# Patient Record
Sex: Male | Born: 1955
Health system: Southern US, Community
[De-identification: ages and names within clinical notes are randomized; demographics above are authoritative.]

## PROBLEM LIST (undated history)

## (undated) DIAGNOSIS — R011 Cardiac murmur, unspecified: Secondary | ICD-10-CM

## (undated) DIAGNOSIS — F419 Anxiety disorder, unspecified: Secondary | ICD-10-CM

## (undated) DIAGNOSIS — E785 Hyperlipidemia, unspecified: Secondary | ICD-10-CM

## (undated) DIAGNOSIS — T7840XA Allergy, unspecified, initial encounter: Secondary | ICD-10-CM

## (undated) DIAGNOSIS — F32A Depression, unspecified: Secondary | ICD-10-CM

## (undated) DIAGNOSIS — F329 Major depressive disorder, single episode, unspecified: Secondary | ICD-10-CM

## (undated) DIAGNOSIS — N400 Enlarged prostate without lower urinary tract symptoms: Secondary | ICD-10-CM

## (undated) DIAGNOSIS — K5790 Diverticulosis of intestine, part unspecified, without perforation or abscess without bleeding: Secondary | ICD-10-CM

## (undated) DIAGNOSIS — I341 Nonrheumatic mitral (valve) prolapse: Secondary | ICD-10-CM

## (undated) HISTORY — PX: WISDOM TOOTH EXTRACTION: SHX21

## (undated) HISTORY — DX: Major depressive disorder, single episode, unspecified: F32.9

## (undated) HISTORY — DX: Diverticulosis of intestine, part unspecified, without perforation or abscess without bleeding: K57.90

## (undated) HISTORY — DX: Benign prostatic hyperplasia without lower urinary tract symptoms: N40.0

## (undated) HISTORY — DX: Allergy, unspecified, initial encounter: T78.40XA

## (undated) HISTORY — DX: Depression, unspecified: F32.A

## (undated) HISTORY — DX: Anxiety disorder, unspecified: F41.9

## (undated) HISTORY — DX: Hyperlipidemia, unspecified: E78.5

## (undated) HISTORY — DX: Nonrheumatic mitral (valve) prolapse: I34.1

## (undated) HISTORY — DX: Cardiac murmur, unspecified: R01.1

---

## 1997-08-21 ENCOUNTER — Encounter (HOSPITAL_COMMUNITY): Admission: RE | Admit: 1997-08-21 | Discharge: 1997-11-19 | Payer: Self-pay | Admitting: Psychiatry

## 2006-09-13 ENCOUNTER — Ambulatory Visit: Payer: Self-pay | Admitting: Internal Medicine

## 2006-09-25 ENCOUNTER — Ambulatory Visit: Payer: Self-pay | Admitting: Internal Medicine

## 2009-04-09 ENCOUNTER — Encounter: Admission: RE | Admit: 2009-04-09 | Discharge: 2009-04-09 | Payer: Self-pay | Admitting: Family Medicine

## 2011-09-06 ENCOUNTER — Encounter: Payer: Self-pay | Admitting: Internal Medicine

## 2012-08-28 ENCOUNTER — Encounter: Payer: Self-pay | Admitting: Internal Medicine

## 2012-11-19 LAB — HEPATIC FUNCTION PANEL
ALK PHOS: 60 U/L (ref 25–125)
ALT: 17 U/L (ref 10–40)
AST: 19 U/L (ref 14–40)
BILIRUBIN, TOTAL: 0.5 mg/dL

## 2012-11-19 LAB — LIPID PANEL
Cholesterol: 168 mg/dL (ref 0–200)
HDL: 101 mg/dL — AB (ref 35–70)
LDL CALC: 87 mg/dL
LDl/HDL Ratio: 6
TRIGLYCERIDES: 41 mg/dL (ref 40–160)

## 2012-11-19 LAB — BASIC METABOLIC PANEL
BUN: 13 mg/dL (ref 4–21)
CREATININE: 1 mg/dL (ref 0.6–1.3)
GLUCOSE: 101 mg/dL
POTASSIUM: 4.1 mmol/L (ref 3.4–5.3)
Sodium: 139 mmol/L (ref 137–147)

## 2012-11-19 LAB — CBC AND DIFFERENTIAL
HEMATOCRIT: 43 % (ref 41–53)
Hemoglobin: 14.7 g/dL (ref 13.5–17.5)
PLATELETS: 212 10*3/uL (ref 150–399)

## 2012-11-19 LAB — PSA: PSA: 1.81

## 2013-03-28 ENCOUNTER — Encounter: Payer: Self-pay | Admitting: Internal Medicine

## 2013-05-22 ENCOUNTER — Encounter: Payer: Self-pay | Admitting: Internal Medicine

## 2014-11-17 ENCOUNTER — Ambulatory Visit (INDEPENDENT_AMBULATORY_CARE_PROVIDER_SITE_OTHER): Payer: 59 | Admitting: Internal Medicine

## 2014-11-17 ENCOUNTER — Ambulatory Visit (INDEPENDENT_AMBULATORY_CARE_PROVIDER_SITE_OTHER): Payer: 59

## 2014-11-17 VITALS — BP 132/84 | HR 78 | Temp 98.9°F | Resp 16 | Ht 71.0 in | Wt 186.8 lb

## 2014-11-17 DIAGNOSIS — S63502A Unspecified sprain of left wrist, initial encounter: Secondary | ICD-10-CM | POA: Diagnosis not present

## 2014-11-17 DIAGNOSIS — M25532 Pain in left wrist: Secondary | ICD-10-CM
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
## 2015-04-22 LAB — BASIC METABOLIC PANEL
BUN: 11 mg/dL (ref 4–21)
CREATININE: 1 mg/dL (ref 0.6–1.3)
Glucose: 89 mg/dL
POTASSIUM: 4.9 mmol/L (ref 3.4–5.3)
Sodium: 140 mmol/L (ref 137–147)

## 2015-04-22 LAB — HEPATIC FUNCTION PANEL
ALK PHOS: 75 U/L (ref 25–125)
ALT: 21 U/L (ref 10–40)
AST: 19 U/L (ref 14–40)
BILIRUBIN, TOTAL: 0.5 mg/dL

## 2015-04-22 LAB — LIPID PANEL
Cholesterol: 207 mg/dL — AB (ref 0–200)
HDL: 81 mg/dL — AB (ref 35–70)
LDL Cholesterol: 117 mg/dL
Triglycerides: 42 mg/dL (ref 40–160)

## 2015-04-22 LAB — HEMOGLOBIN A1C: HEMOGLOBIN A1C: 5.8

## 2015-10-01 ENCOUNTER — Ambulatory Visit (INDEPENDENT_AMBULATORY_CARE_PROVIDER_SITE_OTHER): Payer: 59 | Admitting: Internal Medicine

## 2015-10-01 ENCOUNTER — Encounter: Payer: Self-pay | Admitting: Internal Medicine

## 2015-10-01 VITALS — BP 130/78 | HR 62 | Temp 98.4°F | Ht 71.0 in | Wt 186.0 lb

## 2015-10-01 DIAGNOSIS — Z13220 Encounter for screening for lipoid disorders: Secondary | ICD-10-CM | POA: Diagnosis not present

## 2015-10-01 DIAGNOSIS — N400 Enlarged prostate without lower urinary tract symptoms: Secondary | ICD-10-CM | POA: Insufficient documentation

## 2015-10-01 DIAGNOSIS — I341 Nonrheumatic mitral (valve) prolapse: Secondary | ICD-10-CM

## 2015-10-01 DIAGNOSIS — Z1159 Encounter for screening for other viral diseases: Secondary | ICD-10-CM | POA: Diagnosis not present

## 2015-10-01 DIAGNOSIS — K5790 Diverticulosis of intestine, part unspecified, without perforation or abscess without bleeding: Secondary | ICD-10-CM | POA: Insufficient documentation

## 2015-10-01 DIAGNOSIS — Z114 Encounter for screening for human immunodeficiency virus [HIV]: Secondary | ICD-10-CM

## 2015-10-01 DIAGNOSIS — J309 Allergic rhinitis, unspecified: Secondary | ICD-10-CM

## 2015-10-01 NOTE — Progress Notes (Signed)
Location:  [facility] clinic Provider:  Taylore Hinde L. Mariea Clonts, D.O., C.M.D.  Code Status: full code  Goals of Care:  Advanced Directives 10/01/2015  Does patient have an advance directive? No  Would patient like information on creating an advanced directive? No - patient declined information  They are planning to update the whole package of will, hcpoa, advance directive.  Chief Complaint  Patient presents with  . Establish Care    new patient    HPI: Patient is a 60 y.o. male with h/o diverticulosis, MVP, depression, anxiety, hyperlipidemia, and BPH seen today to establish with me at [facility].  His mother-in-law is seen by me.    He does not have any concerns at this time.  Wanted to get started and be forward thinking.    Diverticulosis:  Just was noted on cscope.  No polyps in 2008.    MVP:  Noted during exams.  Dentists used to worry about it.    Depression/anxiety:  Spirits.  No anxiety lately.    Hyperlipidemia:  High normal or so in past, but says last one nothing was mentioned.  BPH:  No symptoms.  Past Medical History  Diagnosis Date  . Diverticulosis   . Mitral valve prolapse   . Depression   . Anxiety   . Hyperlipidemia   . BPH (benign prostatic hyperplasia)     Past Surgical History  Procedure Laterality Date  . Wisdom tooth extraction     Social History   Social History  . Marital Status: Married    Spouse Name: N/A  . Number of Children: N/A  . Years of Education: N/A   Social History Main Topics  . Smoking status: Former Smoker    Quit date: 11/10/2014  . Smokeless tobacco: Never Used  . Alcohol Use: 3.0 - 4.2 oz/week    5-7 Standard drinks or equivalent per week  . Drug Use: No  . Sexual Activity: Not Asked   Other Topics Concern  . None   Social History Narrative   Diet: Regular      Do you drink/ eat things with caffeine? Yes      Marital status:   Maried                            What year were you married ? 1983      Do you live in a house,  apartment,assistred living, condo, trailer, etc.)? House      Is it one or more stories?  No      How many persons live in your home ?  1      Do you have any pets in your home ?(please list) No      Current or past profession: Freight forwarder at [employer] you exercise? Occasionally                               Type & how often: Walk,Run Bike      Do you have a living will? No      Do you have a DNR form?   No                    If not, do you want to discuss one? Will do so      Do you have signed POA?HPOA forms? No  If so, please bring to your        appointment            Family History  Problem Relation Age of Onset  . Colon cancer Paternal Aunt    No Known Allergies    Medication List       This list is accurate as of: 10/01/15  9:24 AM.  Always use your most recent med list.               cetirizine 10 MG tablet  Commonly known as:  ZYRTEC  Take 10 mg by mouth daily.        Review of Systems:  Review of Systems  Constitutional: Negative for fever, chills and malaise/fatigue.  HENT: Positive for congestion. Negative for hearing loss.        Sinus problems  Eyes: Negative for blurred vision.       Glasses  Respiratory: Negative for cough, shortness of breath and wheezing.   Cardiovascular: Negative for chest pain, palpitations and leg swelling.       MVP, murmur  Gastrointestinal: Negative for abdominal pain, constipation, blood in stool and melena.       Diverticulosis  Genitourinary: Negative for dysuria, urgency and frequency.  Musculoskeletal: Positive for joint pain. Negative for falls.       Left index finger from overuse of mouse  Skin: Negative for itching and rash.  Neurological: Negative for dizziness, focal weakness, loss of consciousness, weakness and headaches.  Endo/Heme/Allergies: Does not bruise/bleed easily.  Psychiatric/Behavioral: Negative for depression and memory loss. The patient is not nervous/anxious and does not have  insomnia.     Health Maintenance  Topic Date Due  . Hepatitis C Screening  1956-02-24  . HIV Screening  11/12/1970  . INFLUENZA VACCINE  12/22/2015  . TETANUS/TDAP  05/23/2016  . COLONOSCOPY  09/24/2016    Physical Exam: Filed Vitals:   10/01/15 0848  BP: 130/78  Pulse: 62  Temp: 98.4 F (36.9 C)  TempSrc: Oral  Height: 5\' 11"  (1.803 m)  Weight: 186 lb (84.369 kg)  SpO2: 98%   Body mass index is 25.95 kg/(m^2). Physical Exam  Constitutional: He is oriented to person, place, and time. He appears well-developed and well-nourished. No distress.  HENT:  Head: Normocephalic and atraumatic.  Cardiovascular: Normal rate, regular rhythm and intact distal pulses.   Murmur heard. Midsystolic click, 2/6 systolic murmur heard throughout precordium  Pulmonary/Chest: Effort normal and breath sounds normal. No respiratory distress.  Abdominal: Soft. Bowel sounds are normal. He exhibits no distension and no mass. There is no tenderness.  Musculoskeletal: Normal range of motion. He exhibits no tenderness.  Neurological: He is alert and oriented to person, place, and time.  Skin: Skin is warm and dry.  Psychiatric: He has a normal mood and affect. His behavior is normal. Judgment and thought content normal.    Labs reviewed: Basic Metabolic Panel:  Recent Labs  04/22/15  NA 140  K 4.9  BUN 11  CREATININE 1.0   Liver Function Tests:  Recent Labs  04/22/15  AST 19  ALT 21  ALKPHOS 75   No results for input(s): LIPASE, AMYLASE in the last 8760 hours. No results for input(s): AMMONIA in the last 8760 hours. CBC: No results for input(s): WBC, NEUTROABS, HGB, HCT, MCV, PLT in the last 8760 hours. Lipid Panel:  Recent Labs  04/22/15  CHOL 207*  HDL 81*  LDLCALC 117  TRIG 42   Lab Results  Component Value Date   HGBA1C 5.8 04/22/2015    Assessment/Plan 1. Mitral valve prolapse -with some mitral regurg noted -he has never had symptoms, was noted when he was a young  adult  2. Allergic sinusitis - cont zyrtec for allergies - CBC with Differential/Platelet; Future  3. BPH (benign prostatic hyperplasia) -cont to monitor, noted by a previous physician, pt asymptomatic  4. Diverticulosis of intestine without bleeding, unspecified intestinal tract location - noted on his cscope in 2008, but has never had diverticulitis or bleeding  - CBC with Differential/Platelet; Future - Comprehensive metabolic panel; Future  5. Screening, lipid - last lipids were normal - Comprehensive metabolic panel; Future - Lipid panel; Future  6. Need for hepatitis C screening test - agrees to screening - Hep C Antibody; Future  7. Screening for HIV (human immunodeficiency virus) - agrees to screening - HIV antibody (with reflex); Future  Labs/tests ordered:   Orders Placed This Encounter  Procedures  . CBC with Differential/Platelet    Standing Status: Future     Number of Occurrences:      Standing Expiration Date: 09/30/2016  . Comprehensive metabolic panel    Standing Status: Future     Number of Occurrences:      Standing Expiration Date: 09/30/2016    Order Specific Question:  Has the patient fasted?    Answer:  Yes  . Lipid panel    Standing Status: Future     Number of Occurrences:      Standing Expiration Date: 09/30/2016    Order Specific Question:  Has the patient fasted?    Answer:  Yes  . HIV antibody (with reflex)    Standing Status: Future     Number of Occurrences:      Standing Expiration Date: 09/30/2016  . Hep C Antibody    Standing Status: Future     Number of Occurrences:      Standing Expiration Date: 09/30/2016   Next appt:  6 mos for annual exam, labs before   Avira Tillison L. Stokely Jeancharles, D.O. [facility] [address], [place] [address] Cell Phone (Mon-Fri 8am-5pm):  [phone] On Call:  [phone] & follow prompts after 5pm & weekends Office Phone:  [phone] Office Fax:   [phone]

## 2015-10-05 ENCOUNTER — Ambulatory Visit: Payer: 59 | Admitting: Internal Medicine

## 2016-01-15 ENCOUNTER — Other Ambulatory Visit: Payer: Self-pay

## 2016-01-15 DIAGNOSIS — Z114 Encounter for screening for human immunodeficiency virus [HIV]: Secondary | ICD-10-CM

## 2016-01-15 DIAGNOSIS — K5790 Diverticulosis of intestine, part unspecified, without perforation or abscess without bleeding: Secondary | ICD-10-CM

## 2016-01-15 DIAGNOSIS — J309 Allergic rhinitis, unspecified: Secondary | ICD-10-CM

## 2016-01-15 DIAGNOSIS — Z13220 Encounter for screening for lipoid disorders: Secondary | ICD-10-CM

## 2016-01-15 DIAGNOSIS — Z1159 Encounter for screening for other viral diseases: Secondary | ICD-10-CM

## 2016-02-18 ENCOUNTER — Other Ambulatory Visit: Payer: Self-pay | Admitting: Internal Medicine

## 2016-02-18 DIAGNOSIS — Z800 Family history of malignant neoplasm of digestive organs: Secondary | ICD-10-CM

## 2016-02-26 ENCOUNTER — Ambulatory Visit (INDEPENDENT_AMBULATORY_CARE_PROVIDER_SITE_OTHER): Payer: 59

## 2016-02-26 DIAGNOSIS — Z23 Encounter for immunization: Secondary | ICD-10-CM | POA: Diagnosis not present

## 2016-03-17 ENCOUNTER — Encounter: Payer: Self-pay | Admitting: Internal Medicine

## 2016-03-30 ENCOUNTER — Other Ambulatory Visit: Payer: 59

## 2016-03-30 DIAGNOSIS — Z1159 Encounter for screening for other viral diseases: Secondary | ICD-10-CM

## 2016-03-30 DIAGNOSIS — Z114 Encounter for screening for human immunodeficiency virus [HIV]: Secondary | ICD-10-CM

## 2016-03-30 DIAGNOSIS — K5790 Diverticulosis of intestine, part unspecified, without perforation or abscess without bleeding: Secondary | ICD-10-CM

## 2016-03-30 DIAGNOSIS — J309 Allergic rhinitis, unspecified: Secondary | ICD-10-CM

## 2016-03-30 DIAGNOSIS — Z13220 Encounter for screening for lipoid disorders: Secondary | ICD-10-CM

## 2016-03-30 LAB — COMPLETE METABOLIC PANEL WITH GFR
ALT: 22 U/L (ref 9–46)
AST: 22 U/L (ref 10–35)
Albumin: 4.5 g/dL (ref 3.6–5.1)
Alkaline Phosphatase: 72 U/L (ref 40–115)
BUN: 11 mg/dL (ref 7–25)
CO2: 25 mmol/L (ref 20–31)
Calcium: 9.5 mg/dL (ref 8.6–10.3)
Chloride: 106 mmol/L (ref 98–110)
Creat: 0.96 mg/dL (ref 0.70–1.25)
GFR, Est African American: >89 mL/min (ref >=60)
GFR, Est Non African American: 86 mL/min (ref >=60)
Glucose, Bld: 105 mg/dL — ABNORMAL HIGH (ref 65–99)
Potassium: 4.7 mmol/L (ref 3.5–5.3)
Sodium: 139 mmol/L (ref 135–146)
Total Bilirubin: 0.6 mg/dL (ref 0.2–1.2)
Total Protein: 6.5 g/dL (ref 6.1–8.1)

## 2016-03-30 LAB — LIPID PANEL
Cholesterol: 198 mg/dL (ref <200)
HDL: 80 mg/dL (ref >40)
LDL Cholesterol: 108 mg/dL — ABNORMAL HIGH
Total CHOL/HDL Ratio: 2.5 Ratio (ref <5.0)
Triglycerides: 48 mg/dL (ref <150)
VLDL: 10 mg/dL (ref <30)

## 2016-03-31 LAB — CBC WITH DIFFERENTIAL/PLATELET
Basophils Absolute: 0 cells/uL (ref 0–200)
Basophils Relative: 0 %
Eosinophils Absolute: 116 cells/uL (ref 15–500)
Eosinophils Relative: 2 %
HCT: 45.2 % (ref 38.5–50.0)
Hemoglobin: 15.6 g/dL (ref 13.2–17.1)
Lymphocytes Relative: 32 %
Lymphs Abs: 1856 cells/uL (ref 850–3900)
MCH: 32.1 pg (ref 27.0–33.0)
MCHC: 34.5 g/dL (ref 32.0–36.0)
MCV: 93 fL (ref 80.0–100.0)
MPV: 11.5 fL (ref 7.5–12.5)
Monocytes Absolute: 464 cells/uL (ref 200–950)
Monocytes Relative: 8 %
Neutro Abs: 3364 cells/uL (ref 1500–7800)
Neutrophils Relative %: 58 %
Platelets: 250 10*3/uL (ref 140–400)
RBC: 4.86 MIL/uL (ref 4.20–5.80)
RDW: 13.5 % (ref 11.0–15.0)
WBC: 5.8 10*3/uL (ref 3.8–10.8)

## 2016-03-31 LAB — HIV ANTIBODY (ROUTINE TESTING W REFLEX): HIV 1&2 Ab, 4th Generation: NONREACTIVE

## 2016-03-31 LAB — HEPATITIS C ANTIBODY: HCV Ab: NEGATIVE

## 2016-04-01 ENCOUNTER — Encounter: Payer: Self-pay | Admitting: Internal Medicine

## 2016-04-01 ENCOUNTER — Ambulatory Visit (INDEPENDENT_AMBULATORY_CARE_PROVIDER_SITE_OTHER): Payer: 59 | Admitting: Internal Medicine

## 2016-04-01 VITALS — BP 128/88 | HR 63 | Temp 98.7°F | Ht 70.08 in | Wt 186.0 lb

## 2016-04-01 DIAGNOSIS — Z0000 Encounter for general adult medical examination without abnormal findings: Secondary | ICD-10-CM | POA: Diagnosis not present

## 2016-04-01 DIAGNOSIS — Z23 Encounter for immunization: Secondary | ICD-10-CM | POA: Diagnosis not present

## 2016-04-01 DIAGNOSIS — J309 Allergic rhinitis, unspecified: Secondary | ICD-10-CM | POA: Diagnosis not present

## 2016-04-01 DIAGNOSIS — I4510 Unspecified right bundle-branch block: Secondary | ICD-10-CM | POA: Diagnosis not present

## 2016-04-01 DIAGNOSIS — N400 Enlarged prostate without lower urinary tract symptoms: Secondary | ICD-10-CM

## 2016-04-01 DIAGNOSIS — I341 Nonrheumatic mitral (valve) prolapse: Secondary | ICD-10-CM | POA: Diagnosis not present

## 2016-04-01 MED ORDER — ZOSTER VACCINE LIVE 19400 UNT/0.65ML ~~LOC~~ SUSR: 0.6500 mL | Freq: Once | SUBCUTANEOUS | 0 refills | Status: DC

## 2016-04-01 MED ORDER — ZOSTER VACCINE LIVE 19400 UNT/0.65ML ~~LOC~~ SUSR
0.6500 mL | Freq: Once | SUBCUTANEOUS | 0 refills | Status: AC
Start: 2016-04-01 — End: 2016-04-01

## 2016-04-01 NOTE — Patient Instructions (Addendum)
I encourage you to develop a regular exercise routine to maintain strength with free weights, regular walking/cycling/swimming and some balance routine like yoga/pilates/tai chi.

## 2016-04-01 NOTE — Progress Notes (Signed)
Provider:  Rexene Edison. Mariea Clonts, D.O., C.M.D. Location:   [place]   Place of Service:   clinic  Previous PCP: Hollace Kinnier, DO Patient Care Team: Gayland Curry, DO as PCP - General (Geriatric Medicine)  Extended Emergency Contact Information Primary Emergency Contact: Rodman Pickle Address: [address]          [place] [address] [place] Phone: [phone] Relation: Spouse  Code Status: full code Goals of Care: Advanced Directive information Advanced Directives 10/01/2015  Does patient have an advance directive? No  Would patient like information on creating an advanced directive? No - patient declined information  Discussed importance of having a living will and hcpoa completed and on file  Chief Complaint  Patient presents with  . Annual Exam    Yearly check-up, EKG, discuss labs (copy printed)   . Immunizations    Shingles Vaccine RX printed and given to patient     HPI: Patient is a 60 y.o. male seen today for an annual physical exam.  No new concerns today.    Past Medical History:  Diagnosis Date  . Anxiety   . BPH (benign prostatic hyperplasia)   . Depression   . Diverticulosis   . Hyperlipidemia   . Mitral valve prolapse    Past Surgical History:  Procedure Laterality Date  . WISDOM TOOTH EXTRACTION      reports that he quit smoking about 16 months ago. He has never used smokeless tobacco. He reports that he drinks about 3.0 - 4.2 oz of alcohol per week . He reports that he does not use drugs.  Functional Status Survey:    Family History  Problem Relation Age of Onset  . Stroke Father   . Colon cancer Father   . Diabetes Mother   . Dementia Mother   . Colon cancer Paternal Aunt     Health Maintenance  Topic Date Due  . ZOSTAVAX  11/12/2015  . COLONOSCOPY  09/24/2016  . TETANUS/TDAP  03/23/2025  . INFLUENZA VACCINE  Completed  . Hepatitis C Screening  Completed  . HIV Screening  Completed    No Known Allergies      Medication List       Accurate as of 04/01/16  8:45 AM. Always use your most recent med list.          cetirizine 10 MG tablet Commonly known as:  ZYRTEC Take 10 mg by mouth daily.   Zoster Vaccine Live (PF) 19400 UNT/0.65ML injection Commonly known as:  ZOSTAVAX Inject 19,400 Units into the skin once.       Review of Systems  Constitutional: Negative for chills, fever and malaise/fatigue.  HENT: Positive for congestion and hearing loss.        More difficulty hearing in high volume ambient settings; takes allergy medication--bad season  Eyes: Negative for blurred vision.       Sees well with his glasses  Respiratory: Negative for shortness of breath.   Cardiovascular: Negative for chest pain, palpitations and leg swelling.  Gastrointestinal: Negative for abdominal pain, blood in stool, constipation and melena.  Genitourinary: Negative for frequency and urgency.  Musculoskeletal: Positive for joint pain. Negative for falls.       Left elbow  Skin: Negative for itching and rash.  Neurological: Negative for dizziness, tingling, sensory change and weakness.  Endo/Heme/Allergies: Positive for environmental allergies.  Psychiatric/Behavioral: Negative for depression and memory loss. The patient does not have insomnia.     Vitals:  04/01/16 0825  BP: 128/88  Pulse: 63  Temp: 98.7 F (37.1 C)  TempSrc: Oral  Weight: 186 lb (84.4 kg)  Height: 5' 10.08" (1.78 m)   Body mass index is 26.63 kg/m. Physical Exam  Constitutional: He is oriented to person, place, and time. He appears well-developed and well-nourished. No distress.  HENT:  Head: Normocephalic and atraumatic.  Right Ear: External ear normal.  Left Ear: External ear normal.  Nose: Nose normal.  Mouth/Throat: Oropharynx is clear and moist. No oropharyngeal exudate.  Eyes: Conjunctivae and EOM are normal. Pupils are equal, round, and reactive to light.  Neck: Normal range of motion. Neck supple. No JVD  present.  Cardiovascular: Normal rate, regular rhythm and intact distal pulses.   Midsystolic click  Pulmonary/Chest: Effort normal and breath sounds normal. No respiratory distress.  Abdominal: Soft. Bowel sounds are normal. He exhibits no distension and no mass. There is no tenderness. There is no rebound and no guarding. No hernia.  Musculoskeletal: Normal range of motion. He exhibits no edema, tenderness or deformity.  Lymphadenopathy:    He has no cervical adenopathy.  Neurological: He is alert and oriented to person, place, and time. He displays normal reflexes. No cranial nerve deficit or sensory deficit. He exhibits normal muscle tone. Coordination normal.  Skin: Skin is warm and dry. Capillary refill takes less than 2 seconds.  Psychiatric: He has a normal mood and affect. His behavior is normal. Judgment and thought content normal.    Labs reviewed: Basic Metabolic Panel:  Recent Labs  04/22/15 03/30/16 0817  NA 140 139  K 4.9 4.7  CL  --  106  CO2  --  25  GLUCOSE  --  105*  BUN 11 11  CREATININE 1.0 0.96  CALCIUM  --  9.5   Liver Function Tests:  Recent Labs  04/22/15 03/30/16 0817  AST 19 22  ALT 21 22  ALKPHOS 75 72  BILITOT  --  0.6  PROT  --  6.5  ALBUMIN  --  4.5   No results for input(s): LIPASE, AMYLASE in the last 8760 hours. No results for input(s): AMMONIA in the last 8760 hours. CBC:  Recent Labs  03/30/16 0817  WBC 5.8  NEUTROABS 3,364  HGB 15.6  HCT 45.2  MCV 93.0  PLT 250   Cardiac Enzymes: No results for input(s): CKTOTAL, CKMB, CKMBINDEX, TROPONINI in the last 8760 hours. BNP: Invalid input(s): POCBNP Lab Results  Component Value Date   HGBA1C 5.8 04/22/2015   No results found for: TSH No results found for: VITAMINB12 No results found for: FOLATE No results found for: IRON, TIBC, FERRITIN  Imaging and Procedures Recently: EKG today NSR with incomplete RBBB, rate 61bpm.  None to compare.  Assessment/Plan 1. Annual  physical exam - discussed zostavax vs. Waiting for new vaccine and also needing to know about his insurance coverage (covered in office or at pharmacy which he was going to check on)--I did give him the zostavax Rx if he opts to get that, but we had really said he should probably wait until the new one is available b/c of its improved efficacy - EKG 12-Lead unremarkable -I encouraged him to develop a regular exercise routine to maintain strength, balance and flexibility  2. Incomplete right bundle branch block (RBBB) determined by electrocardiography -was noted, pt w/o symptoms of any cardiac concerns and otherwise nl ekg  3. Mitral valve prolapse -asymptomatic, but notable on examination  4. Allergic sinusitis -does use zyrtec  during seasonal allergies with some benefit  5. Benign prostatic hyperplasia without lower urinary tract symptoms -no changes, he is to let me know if he develops LUTS  6. Need for Zostavax administration -as in annual exam  Labs/tests ordered: Orders Placed This Encounter  Procedures  . EKG 12-Lead   F/u in 1 year for annual physical exam, labs before  Solomon Skowronek L. Chavon Lucarelli, D.O. [facility] [address], [place] [address] Cell Phone (Mon-Fri 8am-5pm):  [phone] On Call:  [phone] & follow prompts after 5pm & weekends Office Phone:  [phone] Office Fax:  [phone]

## 2016-04-21 ENCOUNTER — Encounter: Payer: Self-pay | Admitting: Internal Medicine

## 2017-03-25 DIAGNOSIS — Z23 Encounter for immunization: Secondary | ICD-10-CM | POA: Diagnosis not present

## 2017-06-15 ENCOUNTER — Encounter: Payer: Self-pay | Admitting: Internal Medicine

## 2017-06-22 ENCOUNTER — Ambulatory Visit (INDEPENDENT_AMBULATORY_CARE_PROVIDER_SITE_OTHER): Payer: 59 | Admitting: Internal Medicine

## 2017-06-22 ENCOUNTER — Encounter: Payer: Self-pay | Admitting: Internal Medicine

## 2017-06-22 VITALS — BP 130/78 | HR 60 | Temp 97.7°F | Ht 70.0 in | Wt 187.0 lb

## 2017-06-22 DIAGNOSIS — Z0000 Encounter for general adult medical examination without abnormal findings: Secondary | ICD-10-CM | POA: Diagnosis not present

## 2017-06-22 DIAGNOSIS — Z23 Encounter for immunization: Secondary | ICD-10-CM

## 2017-06-22 DIAGNOSIS — I341 Nonrheumatic mitral (valve) prolapse: Secondary | ICD-10-CM

## 2017-06-22 DIAGNOSIS — K5790 Diverticulosis of intestine, part unspecified, without perforation or abscess without bleeding: Secondary | ICD-10-CM

## 2017-06-22 DIAGNOSIS — J309 Allergic rhinitis, unspecified: Secondary | ICD-10-CM | POA: Diagnosis not present

## 2017-06-22 DIAGNOSIS — N400 Enlarged prostate without lower urinary tract symptoms: Secondary | ICD-10-CM | POA: Diagnosis not present

## 2017-06-22 DIAGNOSIS — R739 Hyperglycemia, unspecified: Secondary | ICD-10-CM | POA: Diagnosis not present

## 2017-06-22 MED ORDER — ZOSTER VAC RECOMB ADJUVANTED 50 MCG/0.5ML IM SUSR: 0.5000 mL | Freq: Once | INTRAMUSCULAR | 1 refills | Status: AC

## 2017-06-22 NOTE — Progress Notes (Signed)
Provider:  Rexene Edison. Mariea Clonts, D.O., C.M.D. Location:   [facility]   Place of Service:   clinic  Previous PCP: Gayland Curry, DO Patient Care Team: Gayland Curry, DO as PCP - General (Geriatric Medicine)  Extended Emergency Contact Information Primary Emergency Contact: Rodman Pickle Address: [address]          [place] [address] [place] Phone: [phone] Relation: Spouse  Code Status: FULL CODE Goals of Care: Advanced Directive information Advanced Directives 10/01/2015  Does Patient Have a Medical Advance Directive? No  Would patient like information on creating a medical advance directive? No - patient declined information   Chief Complaint  Patient presents with  . Annual Exam    CPE  . Medication Refill    No Refills    HPI: Patient is a 62 y.o. male seen today for an annual physical exam.  Has cscope scheduled for 3/20.  No problems with abdominal pain, fever.   BPH:  No urinary frequency.    Might notice a little difficulty with hearing with higher ambient noise in a social setting.    He's not as active as he'd like to be.  Gets his exercise on the weekends, but not during the week.  Tries to eat healthy balanced meals.  No increases in alcohol intake.    Spirits are good--getting happier all the time as he reaches retirement.  Has small "mole" on upper back near C7.   Past Medical History:  Diagnosis Date  . Anxiety   . BPH (benign prostatic hyperplasia)   . Depression   . Diverticulosis   . Hyperlipidemia   . Mitral valve prolapse    Past Surgical History:  Procedure Laterality Date  . WISDOM TOOTH EXTRACTION      reports that he quit smoking about 2 years ago. he has never used smokeless tobacco. He reports that he drinks about 3.0 - 4.2 oz of alcohol per week. He reports that he does not use drugs.  Functional Status Survey:    Family History  Problem Relation Age of Onset  . Stroke Father   . Colon cancer  Father   . Diabetes Mother   . Dementia Mother   . Colon cancer Paternal Aunt     Health Maintenance  Topic Date Due  . COLONOSCOPY  09/24/2016  . TETANUS/TDAP  03/23/2025  . INFLUENZA VACCINE  Completed  . Hepatitis C Screening  Completed  . HIV Screening  Completed    No Known Allergies  Outpatient Encounter Medications as of 06/22/2017  Medication Sig  . cetirizine (ZYRTEC) 10 MG tablet Take 10 mg by mouth daily.   No facility-administered encounter medications on file as of 06/22/2017.     Review of Systems  Constitutional: Negative for chills, fever and malaise/fatigue.  HENT: Negative for congestion.        Notes slight difficulty with hearing in settings with several other people talking like restaurants for example; allergy symptoms about 2 weeks in spring each year when takes antihistamine  Eyes: Negative for blurred vision.  Respiratory: Negative for cough and shortness of breath.   Cardiovascular: Negative for chest pain, palpitations and leg swelling.  Gastrointestinal: Negative for abdominal pain, blood in stool, constipation, diarrhea, heartburn, melena, nausea and vomiting.  Genitourinary: Negative for dysuria, frequency and urgency.  Musculoskeletal: Negative for falls and joint pain.  Skin: Negative for itching and rash.  Neurological: Negative for dizziness, loss of consciousness and weakness.  Endo/Heme/Allergies: Does  not bruise/bleed easily.  Psychiatric/Behavioral: Negative for depression and memory loss. The patient is not nervous/anxious and does not have insomnia.     Vitals:   06/22/17 0844  BP: 130/78  Pulse: 60  Temp: 97.7 F (36.5 C)  TempSrc: Oral  SpO2: 96%  Weight: 187 lb (84.8 kg)  Height: 5\' 10"  (1.778 m)   Body mass index is 26.83 kg/m. Physical Exam  Constitutional: He is oriented to person, place, and time. He appears well-developed and well-nourished. No distress.  HENT:  Head: Normocephalic and atraumatic.  Right Ear:  External ear normal.  Left Ear: External ear normal.  Nose: Nose normal.  Mouth/Throat: Oropharynx is clear and moist. No oropharyngeal exudate.  Eyes: Conjunctivae and EOM are normal. Pupils are equal, round, and reactive to light.  Neck: Normal range of motion. Neck supple. No JVD present. No thyromegaly present.  Cardiovascular: Normal rate, regular rhythm, normal heart sounds and intact distal pulses.  Midsystolic click  Pulmonary/Chest: Effort normal and breath sounds normal. No respiratory distress.  Abdominal: Soft. Bowel sounds are normal. He exhibits no distension and no mass. There is no tenderness. There is no rebound and no guarding.  Musculoskeletal: Normal range of motion. He exhibits no edema, tenderness or deformity.  Lymphadenopathy:    He has no cervical adenopathy.  Neurological: He is alert and oriented to person, place, and time. No cranial nerve deficit.  Unable to get reflexes, but wearing jeans today  Skin: Skin is warm and dry. Capillary refill takes less than 2 seconds.  Small raised papule just inferior to c7 vertebra  Psychiatric: He has a normal mood and affect. His behavior is normal. Judgment and thought content normal.    Labs reviewed: Basic Metabolic Panel: No results for input(s): NA, K, CL, CO2, GLUCOSE, BUN, CREATININE, CALCIUM, MG, PHOS in the last 8760 hours. Liver Function Tests: No results for input(s): AST, ALT, ALKPHOS, BILITOT, PROT, ALBUMIN in the last 8760 hours. No results for input(s): LIPASE, AMYLASE in the last 8760 hours. No results for input(s): AMMONIA in the last 8760 hours. CBC: No results for input(s): WBC, NEUTROABS, HGB, HCT, MCV, PLT in the last 8760 hours. Cardiac Enzymes: No results for input(s): CKTOTAL, CKMB, CKMBINDEX, TROPONINI in the last 8760 hours. BNP: Invalid input(s): POCBNP Lab Results  Component Value Date   HGBA1C 5.8 04/22/2015    Assessment/Plan 1. Need for shingles vaccine - Zoster Vaccine Adjuvanted  [facility]) injection; Inject 0.5 mLs into the muscle once for 1 dose.  Dispense: 0.5 mL; Refill: 1  2. Mitral valve prolapse -stable, asymptomatic  3. Allergic sinusitis -uses antihistamine 2 weeks out of the year  4. Benign prostatic hyperplasia without lower urinary tract symptoms -doing fine, no new symptoms  5. Diverticulosis of intestine without bleeding, unspecified intestinal tract location -is for his cscope coming up, await report  6. Annual physical exam -performed today, doing well, up to date except cscope which is already scheduled and shingrix which I ordered today - CBC with Differential/Platelet - COMPLETE METABOLIC PANEL WITH GFR - Lipid panel - Hemoglobin A1c - CBC with Differential/Platelet; Future - COMPLETE METABOLIC PANEL WITH GFR; Future - Hemoglobin A1c; Future - Lipid panel; Future  7. Hyperglycemia - f/u labs today and again before his CPE: - Lipid panel - Hemoglobin A1c - Hemoglobin A1c; Future  Labs/tests ordered: Orders Placed This Encounter  Procedures  . CBC with Differential/Platelet  . COMPLETE METABOLIC PANEL WITH GFR  . Lipid panel  . Hemoglobin A1c  .  CBC with Differential/Platelet    Standing Status:   Future    Standing Expiration Date:   06/23/2019  . COMPLETE METABOLIC PANEL WITH GFR    Standing Status:   Future    Standing Expiration Date:   06/23/2019  . Hemoglobin A1c    Standing Status:   Future    Standing Expiration Date:   06/23/2019  . Lipid panel    Standing Status:   Future    Standing Expiration Date:   06/23/2019   F/u 1 year for annual exam, labs before and PRN  Ziza Hastings L. Bartow Zylstra, D.O. [facility] [address], [place] [address] Cell Phone (Mon-Fri 8am-5pm):  [phone] On Call:  [phone] & follow prompts after 5pm & weekends Office Phone:  [phone] Office Fax:  [phone]

## 2017-06-22 NOTE — Addendum Note (Signed)
Addended by: Despina Hidden on: 06/22/2017 10:25 AM   Modules accepted: Orders

## 2017-06-22 NOTE — Addendum Note (Signed)
Addended by: Despina Hidden on: 06/22/2017 10:17 AM   Modules accepted: Orders

## 2017-06-22 NOTE — Patient Instructions (Signed)
Please bring [place] a copy of your living will and health care power of attorney documentation.

## 2017-06-23 LAB — COMPLETE METABOLIC PANEL WITH GFR
AG Ratio: 2.4 (calc) (ref 1.0–2.5)
ALT: 22 U/L (ref 9–46)
AST: 18 U/L (ref 10–35)
Albumin: 4.7 g/dL (ref 3.6–5.1)
Alkaline phosphatase (APISO): 78 U/L (ref 40–115)
BUN: 13 mg/dL (ref 7–25)
CO2: 29 mmol/L (ref 20–32)
Calcium: 9.8 mg/dL (ref 8.6–10.3)
Chloride: 101 mmol/L (ref 98–110)
Creat: 1.05 mg/dL (ref 0.70–1.25)
GFR, Est African American: 88 mL/min/{1.73_m2} (ref >=60)
GFR, Est Non African American: 76 mL/min/{1.73_m2} (ref >=60)
Globulin: 2 g/dL (calc) (ref 1.9–3.7)
Glucose, Bld: 96 mg/dL (ref 65–99)
Potassium: 4.7 mmol/L (ref 3.5–5.3)
Sodium: 137 mmol/L (ref 135–146)
Total Bilirubin: 0.5 mg/dL (ref 0.2–1.2)
Total Protein: 6.7 g/dL (ref 6.1–8.1)

## 2017-06-23 LAB — LIPID PANEL
Cholesterol: 183 mg/dL (ref <200)
HDL: 87 mg/dL (ref >40)
LDL Cholesterol (Calc): 85 mg/dL (calc)
Non-HDL Cholesterol (Calc): 96 mg/dL (calc) (ref <130)
Total CHOL/HDL Ratio: 2.1 (calc) (ref <5.0)
Triglycerides: 38 mg/dL (ref <150)

## 2017-06-23 LAB — CBC WITH DIFFERENTIAL/PLATELET
Basophils Absolute: 48 cells/uL (ref 0–200)
Basophils Relative: 0.9 %
Eosinophils Absolute: 80 cells/uL (ref 15–500)
Eosinophils Relative: 1.5 %
HCT: 44.7 % (ref 38.5–50.0)
Hemoglobin: 15.7 g/dL (ref 13.2–17.1)
Lymphs Abs: 1754 cells/uL (ref 850–3900)
MCH: 31.3 pg (ref 27.0–33.0)
MCHC: 35.1 g/dL (ref 32.0–36.0)
MCV: 89 fL (ref 80.0–100.0)
MPV: 11.5 fL (ref 7.5–12.5)
Monocytes Relative: 8.6 %
Neutro Abs: 2963 cells/uL (ref 1500–7800)
Neutrophils Relative %: 55.9 %
Platelets: 286 10*3/uL (ref 140–400)
RBC: 5.02 10*6/uL (ref 4.20–5.80)
RDW: 12.2 % (ref 11.0–15.0)
Total Lymphocyte: 33.1 %
WBC mixed population: 456 cells/uL (ref 200–950)
WBC: 5.3 10*3/uL (ref 3.8–10.8)

## 2017-06-23 LAB — HEMOGLOBIN A1C
Hgb A1c MFr Bld: 5.8 % of total Hgb — ABNORMAL HIGH (ref <5.7)
Mean Plasma Glucose: 120 (calc)
eAG (mmol/L): 6.6 (calc)

## 2017-06-23 LAB — PSA: PSA: 1.9 ng/mL (ref <=4.0)

## 2017-07-30 ENCOUNTER — Encounter: Payer: Self-pay | Admitting: Internal Medicine

## 2017-08-01 ENCOUNTER — Other Ambulatory Visit: Payer: Self-pay

## 2017-08-01 ENCOUNTER — Ambulatory Visit (AMBULATORY_SURGERY_CENTER): Payer: Self-pay

## 2017-08-01 VITALS — Ht 70.0 in | Wt 185.8 lb

## 2017-08-01 DIAGNOSIS — Z1211 Encounter for screening for malignant neoplasm of colon: Secondary | ICD-10-CM

## 2017-08-01 MED ORDER — NA SULFATE-K SULFATE-MG SULF 17.5-3.13-1.6 GM/177ML PO SOLN: 1.0000 | Freq: Once | ORAL | 0 refills | Status: AC

## 2017-08-01 NOTE — Progress Notes (Signed)
Denies allergies to eggs or soy products. Denies complication of anesthesia or sedation. Denies use of weight loss medication. Denies use of O2.   Emmi instructions declined.  

## 2017-08-02 ENCOUNTER — Encounter: Payer: Self-pay | Admitting: Internal Medicine

## 2017-08-07 ENCOUNTER — Telehealth: Payer: Self-pay

## 2017-08-07 NOTE — Telephone Encounter (Signed)
Pts colon appt moved from 11am to 9am 08/09/17. Pt aware and knows to adjust his prep.

## 2017-08-09 ENCOUNTER — Other Ambulatory Visit: Payer: Self-pay

## 2017-08-09 ENCOUNTER — Encounter: Payer: Self-pay | Admitting: Internal Medicine

## 2017-08-09 ENCOUNTER — Ambulatory Visit (AMBULATORY_SURGERY_CENTER): Payer: 59 | Admitting: Internal Medicine

## 2017-08-09 VITALS — BP 125/79 | HR 57 | Temp 96.3°F | Resp 12 | Ht 70.0 in | Wt 187.0 lb

## 2017-08-09 DIAGNOSIS — D124 Benign neoplasm of descending colon: Secondary | ICD-10-CM

## 2017-08-09 DIAGNOSIS — Z1211 Encounter for screening for malignant neoplasm of colon: Secondary | ICD-10-CM | POA: Diagnosis present

## 2017-08-09 DIAGNOSIS — D120 Benign neoplasm of cecum: Secondary | ICD-10-CM | POA: Diagnosis not present

## 2017-08-09 MED ORDER — SODIUM CHLORIDE 0.9 % IV SOLN: 500.0000 mL | Freq: Once | INTRAVENOUS | Status: DC

## 2017-08-09 NOTE — Progress Notes (Signed)
Called to room to assist during endoscopic procedure.  Patient ID and intended procedure confirmed with present staff. Received instructions for my participation in the procedure from the performing physician.  

## 2017-08-09 NOTE — Patient Instructions (Signed)
YOU HAD AN ENDOSCOPIC PROCEDURE TODAY AT [facility]:   Refer to the procedure report that was given to you for any specific questions about what was found during the examination.  If the procedure report does not answer your questions, please call your gastroenterologist to clarify.  If you requested that your care partner not be given the details of your procedure findings, then the procedure report has been included in a sealed envelope for you to review at your convenience later.  YOU SHOULD EXPECT: Some feelings of bloating in the abdomen. Passage of more gas than usual.  Walking can help get rid of the air that was put into your GI tract during the procedure and reduce the bloating. If you had a lower endoscopy (such as a colonoscopy or flexible sigmoidoscopy) you may notice spotting of blood in your stool or on the toilet paper. If you underwent a bowel prep for your procedure, you may not have a normal bowel movement for a few days.  Please Note:  You might notice some irritation and congestion in your nose or some drainage.  This is from the oxygen used during your procedure.  There is no need for concern and it should clear up in a day or so.  SYMPTOMS TO REPORT IMMEDIATELY:   Following lower endoscopy (colonoscopy or flexible sigmoidoscopy):  Excessive amounts of blood in the stool  Significant tenderness or worsening of abdominal pains  Swelling of the abdomen that is new, acute  Fever of 100F or higher    For urgent or emergent issues, a gastroenterologist can be reached at any hour by calling [phone].   DIET:  We do recommend a small meal at first, but then you may proceed to your regular diet.  Drink plenty of fluids but you should avoid alcoholic beverages for 24 hours.  ACTIVITY:  You should plan to take it easy for the rest of today and you should NOT DRIVE or use heavy machinery until tomorrow (because of the sedation medicines used during the test).     FOLLOW UP: Our staff will call the number listed on your records the next business day following your procedure to check on you and address any questions or concerns that you may have regarding the information given to you following your procedure. If we do not reach you, we will leave a message.  However, if you are feeling well and you are not experiencing any problems, there is no need to return our call.  We will assume that you have returned to your regular daily activities without incident.  If any biopsies were taken you will be contacted by phone or by letter within the next 1-3 weeks.  Please call us at [phone] if you have not heard about the biopsies in 3 weeks.    SIGNATURES/CONFIDENTIALITY: You and/or your care partner have signed paperwork which will be entered into your electronic medical record.  These signatures attest to the fact that that the information above on your After Visit Summary has been reviewed and is understood.  Full responsibility of the confidentiality of this discharge information lies with you and/or your care-partner.   Resume medications. Information given on polyps,diveticulosis and hemorrhoids.

## 2017-08-09 NOTE — Op Note (Signed)
[facility] Patient Name: Carlos Hickman Procedure Date: 08/09/2017 9:02 AM MRN: [id number] Endoscopist: Docia Chuck. Henrene Pastor , MD Age: 62 Referring MD:  Date of Birth: 08/13/1955 Gender: Male Account #: [id number] Procedure:                Colonoscopy, With cold snare polypectomy x 2 Indications:              Screening for colorectal malignant neoplasm.                            Negative index examination May 2008 Medicines:                Monitored Anesthesia Care Procedure:                Pre-Anesthesia Assessment:                           - Prior to the procedure, a History and Physical                            was performed, and patient medications and                            allergies were reviewed. The patient's tolerance of                            previous anesthesia was also reviewed. The risks                            and benefits of the procedure and the sedation                            options and risks were discussed with the patient.                            All questions were answered, and informed consent                            was obtained. Prior Anticoagulants: The patient has                            taken no previous anticoagulant or antiplatelet                            agents. ASA Grade Assessment: I - A normal, healthy                            patient. After reviewing the risks and benefits,                            the patient was deemed in satisfactory condition to                            undergo the procedure.  After obtaining informed consent, the colonoscope                            was passed under direct vision. Throughout the                            procedure, the patient's blood pressure, pulse, and                            oxygen saturations were monitored continuously. The                            Colonoscope was introduced through the anus and                            advanced to  the the cecum, identified by                            appendiceal orifice and ileocecal valve. The                            ileocecal valve, appendiceal orifice, and rectum                            were photographed. The quality of the bowel                            preparation was good. The colonoscopy was performed                            without difficulty. The patient tolerated the                            procedure well. The bowel preparation used was                            SUPREP. Scope In: 9:18:54 AM Scope Out: 9:45:01 AM Scope Withdrawal Time: 0 hours 22 minutes 35 seconds  Total Procedure Duration: 0 hours 26 minutes 7 seconds  Findings:                 Two polyps were found in the descending colon and                            cecum. The polyps were 3 to 10 mm in size. These                            polyps were removed with a cold snare. Resection                            and retrieval were complete.                           Multiple small and large-mouthed diverticula were  found in the left colon.                           Internal hemorrhoids were found during retroflexion.                           The exam was otherwise without abnormality on                            direct and retroflexion views. Complications:            No immediate complications. Estimated blood loss:                            None. Estimated Blood Loss:     Estimated blood loss: none. Impression:               - Two 3 to 10 mm polyps in the descending colon and                            in the cecum, removed with a cold snare. Resected                            and retrieved.                           - Diverticulosis in the left colon.                           - Internal hemorrhoids.                           - The examination was otherwise normal on direct                            and retroflexion views. Recommendation:           - Repeat  colonoscopy in 3 years for surveillance.                           - Patient has a contact number available for                            emergencies. The signs and symptoms of potential                            delayed complications were discussed with the                            patient. Return to normal activities tomorrow.                            Written discharge instructions were provided to the                            patient.                           -  Resume previous diet.                           - Continue present medications.                           - Await pathology results. Docia Chuck. Henrene Pastor, MD 08/09/2017 9:52:18 AM This report has been signed electronically.

## 2017-08-09 NOTE — Progress Notes (Signed)
Report to PACU, RN, vss, BBS= Clear.  

## 2017-08-10 ENCOUNTER — Telehealth: Payer: Self-pay

## 2017-08-10 NOTE — Telephone Encounter (Signed)
  Follow up Call-  Call back number 08/09/2017  Post procedure Call Back phone  # [phone]  Permission to leave phone message Yes  Some recent data might be hidden     Patient questions:  Do you have a fever, pain , or abdominal swelling? No. Pain Score  0 *  Have you tolerated food without any problems? Yes.    Have you been able to return to your normal activities? Yes.    Do you have any questions about your discharge instructions: Diet   No. Medications  No. Follow up visit  No.  Do you have questions or concerns about your Care? No.  Actions: * If pain score is 4 or above: No action needed, pain <4.

## 2017-08-15 ENCOUNTER — Encounter: Payer: Self-pay | Admitting: Internal Medicine

## 2017-09-27 ENCOUNTER — Encounter: Payer: Self-pay | Admitting: Internal Medicine

## 2017-10-22 DIAGNOSIS — Z87891 Personal history of nicotine dependence: Secondary | ICD-10-CM | POA: Diagnosis not present

## 2017-10-22 DIAGNOSIS — M25569 Pain in unspecified knee: Secondary | ICD-10-CM | POA: Diagnosis not present

## 2017-10-25 ENCOUNTER — Other Ambulatory Visit: Payer: Self-pay | Admitting: Internal Medicine

## 2017-10-25 DIAGNOSIS — S8990XA Unspecified injury of unspecified lower leg, initial encounter: Secondary | ICD-10-CM

## 2017-10-25 NOTE — Progress Notes (Signed)
Referral placed to orthopedics due to suspected meniscal tear sustained while vacationing in [place] (see in urgent care).  Pt is now using a full brace and crutches to get around and in incredible pain.  Will be back in town 6/9 so appt must be next week.

## 2017-10-31 ENCOUNTER — Encounter (INDEPENDENT_AMBULATORY_CARE_PROVIDER_SITE_OTHER): Payer: Self-pay | Admitting: Orthopaedic Surgery

## 2017-10-31 ENCOUNTER — Ambulatory Visit (INDEPENDENT_AMBULATORY_CARE_PROVIDER_SITE_OTHER): Payer: 59

## 2017-10-31 ENCOUNTER — Ambulatory Visit (INDEPENDENT_AMBULATORY_CARE_PROVIDER_SITE_OTHER): Payer: 59 | Admitting: Orthopaedic Surgery

## 2017-10-31 DIAGNOSIS — M25562 Pain in left knee: Secondary | ICD-10-CM

## 2017-10-31 NOTE — Progress Notes (Signed)
Office Visit Note   Patient: Carlos Hickman           Date of Birth: 1955-11-27           MRN: [id number] Visit Date: 10/31/2017              Requested by: Gayland Curry, DO [place],  [id number] PCP: Gayland Curry, DO   Assessment & Plan: Visit Diagnoses:  1. Acute pain of left knee     Plan: Impression is acute medial and possibly lateral meniscal tears.  Recommend MRI to evaluate for this.  He does have a significant joint effusion.  He is walking with an antalgic gait.  Follow-up after the MRI.  Follow-Up Instructions: Return in about 10 days (around 11/10/2017).   Orders:  Orders Placed This Encounter  Procedures  . XR KNEE 3 VIEW LEFT  . MR Knee Left w/o contrast   No orders of the defined types were placed in this encounter.     Procedures: No procedures performed   Clinical Data: No additional findings.   Subjective: Chief Complaint  Patient presents with  . Left Knee - Pain    Carlos Hickman is a very pleasant 62 year old gentleman who comes in with an acute injury to his left knee couple weeks ago.  He was unloading of the boat when he twisted his knee and fell.  Couple hours afterwards he developed pain and swelling and difficulty weightbearing.  He follows up today for this problem.  Denies any numbness and tingling.  He does endorse swelling that has improved some   Review of Systems  Constitutional: Negative.   All other systems reviewed and are negative.    Objective: Vital Signs: There were no vitals taken for this visit.  Physical Exam  Constitutional: He is oriented to person, place, and time. He appears well-developed and well-nourished.  HENT:  Head: Normocephalic and atraumatic.  Eyes: Pupils are equal, round, and reactive to light.  Neck: Neck supple.  Pulmonary/Chest: Effort normal.  Abdominal: Soft.  Musculoskeletal: Normal range of motion.  Neurological: He is alert and oriented to person, place, and time.  Skin:  Skin is warm.  Psychiatric: He has a normal mood and affect. His behavior is normal. Judgment and thought content normal.  Nursing note and vitals reviewed.   Ortho Exam Left knee exam shows a moderate joint effusion.  He is exquisitely tender over the medial and lateral joint line.  Collaterals and cruciates are stable. Specialty Comments:  No specialty comments available.  Imaging: Xr Knee 3 View Left  Result Date: 10/31/2017 No acute or structural abnormalities    PMFS History: Patient Active Problem List   Diagnosis Date Noted  . Mitral valve prolapse 10/01/2015  . Allergic sinusitis 10/01/2015  . BPH (benign prostatic hyperplasia) 10/01/2015  . Diverticulosis 10/01/2015   Past Medical History:  Diagnosis Date  . Allergy   . Anxiety   . BPH (benign prostatic hyperplasia)   . Depression   . Diverticulosis   . Heart murmur   . Hyperlipidemia   . Mitral valve prolapse     Family History  Problem Relation Age of Onset  . Stroke Father   . Colon cancer Father   . Diabetes Mother   . Dementia Mother   . Colon cancer Paternal Aunt   . Esophageal cancer Neg Hx   . Liver cancer Neg Hx   . Pancreatic cancer Neg Hx   . Rectal cancer  Neg Hx   . Stomach cancer Neg Hx     Past Surgical History:  Procedure Laterality Date  . WISDOM TOOTH EXTRACTION     Social History   Occupational History  . Not on file  Tobacco Use  . Smoking status: Former Smoker    Last attempt to quit: 11/10/2014    Years since quitting: 2.9  . Smokeless tobacco: Never Used  Substance and Sexual Activity  . Alcohol use: Yes    Alcohol/week: 3.0 - 4.2 oz    Types: 5 - 7 Standard drinks or equivalent per week    Comment: 3 or 4 times a week  . Drug use: No  . Sexual activity: Not on file

## 2017-10-31 NOTE — Progress Notes (Deleted)
   Office Visit Note   Patient: Carlos Hickman           Date of Birth: 09-10-1955           MRN: [id number] Visit Date: 10/31/2017              Requested by: Gayland Curry, DO [place], [place] [address] PCP: Gayland Curry, DO   Assessment & Plan: Visit Diagnoses:  1. Acute pain of left knee     Plan: ***  Follow-Up Instructions: Return in about 10 days (around 11/10/2017).   Orders:  Orders Placed This Encounter  Procedures  . XR KNEE 3 VIEW LEFT  . MR Knee Left w/o contrast   No orders of the defined types were placed in this encounter.     Procedures: No procedures performed   Clinical Data: No additional findings.   Subjective: Chief Complaint  Patient presents with  . Left Knee - Pain    HPI  Review of Systems   Objective: Vital Signs: There were no vitals taken for this visit.  Physical Exam  Ortho Exam  Specialty Comments:  No specialty comments available.  Imaging: Xr Knee 3 View Left  Result Date: 10/31/2017 No acute or structural abnormalities    PMFS History: Patient Active Problem List   Diagnosis Date Noted  . Mitral valve prolapse 10/01/2015  . Allergic sinusitis 10/01/2015  . BPH (benign prostatic hyperplasia) 10/01/2015  . Diverticulosis 10/01/2015   Past Medical History:  Diagnosis Date  . Allergy   . Anxiety   . BPH (benign prostatic hyperplasia)   . Depression   . Diverticulosis   . Heart murmur   . Hyperlipidemia   . Mitral valve prolapse     Family History  Problem Relation Age of Onset  . Stroke Father   . Colon cancer Father   . Diabetes Mother   . Dementia Mother   . Colon cancer Paternal Aunt   . Esophageal cancer Neg Hx   . Liver cancer Neg Hx   . Pancreatic cancer Neg Hx   . Rectal cancer Neg Hx   . Stomach cancer Neg Hx     Past Surgical History:  Procedure Laterality Date  . WISDOM TOOTH EXTRACTION     Social History   Occupational History  . Not on file  Tobacco Use  .  Smoking status: Former Smoker    Last attempt to quit: 11/10/2014    Years since quitting: 2.9  . Smokeless tobacco: Never Used  Substance and Sexual Activity  . Alcohol use: Yes    Alcohol/week: 3.0 - 4.2 oz    Types: 5 - 7 Standard drinks or equivalent per week    Comment: 3 or 4 times a week  . Drug use: No  . Sexual activity: Not on file

## 2017-11-02 ENCOUNTER — Ambulatory Visit
Admission: RE | Admit: 2017-11-02 | Discharge: 2017-11-02 | Disposition: A | Discharge status: Home / Self Care | Payer: 59 | Source: Ambulatory Visit | Attending: Orthopaedic Surgery | Admitting: Orthopaedic Surgery

## 2017-11-02 DIAGNOSIS — M25562 Pain in left knee: Secondary | ICD-10-CM

## 2017-11-02 DIAGNOSIS — S82122A Displaced fracture of lateral condyle of left tibia, initial encounter for closed fracture: Secondary | ICD-10-CM | POA: Diagnosis not present

## 2017-11-09 ENCOUNTER — Ambulatory Visit (INDEPENDENT_AMBULATORY_CARE_PROVIDER_SITE_OTHER): Payer: 59 | Admitting: Orthopaedic Surgery

## 2017-11-09 DIAGNOSIS — M25562 Pain in left knee: Secondary | ICD-10-CM

## 2017-11-09 NOTE — Progress Notes (Signed)
Office Visit Note   Patient: Carlos Hickman           Date of Birth: 18-Nov-1955           MRN: [id number] Visit Date: 11/09/2017              Requested by: Gayland Curry, DO [place], [place] [address] PCP: Gayland Curry, DO   Assessment & Plan: Visit Diagnoses:  1. Acute pain of left knee     Plan: MRI findings are consistent with an impaction fracture of the lateral tibial plateau without any depression or displacement.  He has some mild chondromalacia in the knee that is chronic.  We discussed activity modification and assistive walking devices as needed.  Recommend vitamin D and calcium supplements.  Questions encouraged and answered.  Follow-up as needed.  Follow-Up Instructions: Return if symptoms worsen or fail to improve.   Orders:  No orders of the defined types were placed in this encounter.  No orders of the defined types were placed in this encounter.     Procedures: No procedures performed   Clinical Data: No additional findings.   Subjective: Chief Complaint  Patient presents with  . Left Knee - Pain    Carlos Hickman returns today for MRI review.  He is overall feeling just slightly better.   Review of Systems  Constitutional: Negative.   All other systems reviewed and are negative.    Objective: Vital Signs: There were no vitals taken for this visit.  Physical Exam  Constitutional: He is oriented to person, place, and time. He appears well-developed and well-nourished.  Pulmonary/Chest: Effort normal.  Abdominal: Soft.  Neurological: He is alert and oriented to person, place, and time.  Skin: Skin is warm.  Psychiatric: He has a normal mood and affect. His behavior is normal. Judgment and thought content normal.  Nursing note and vitals reviewed.   Ortho Exam Left knee exam is stable.  He has pain mainly on the lateral joint line. Specialty Comments:  No specialty comments available.  Imaging: No results found.   PMFS  History: Patient Active Problem List   Diagnosis Date Noted  . Mitral valve prolapse 10/01/2015  . Allergic sinusitis 10/01/2015  . BPH (benign prostatic hyperplasia) 10/01/2015  . Diverticulosis 10/01/2015   Past Medical History:  Diagnosis Date  . Allergy   . Anxiety   . BPH (benign prostatic hyperplasia)   . Depression   . Diverticulosis   . Heart murmur   . Hyperlipidemia   . Mitral valve prolapse     Family History  Problem Relation Age of Onset  . Stroke Father   . Colon cancer Father   . Diabetes Mother   . Dementia Mother   . Colon cancer Paternal Aunt   . Esophageal cancer Neg Hx   . Liver cancer Neg Hx   . Pancreatic cancer Neg Hx   . Rectal cancer Neg Hx   . Stomach cancer Neg Hx     Past Surgical History:  Procedure Laterality Date  . WISDOM TOOTH EXTRACTION     Social History   Occupational History  . Not on file  Tobacco Use  . Smoking status: Former Smoker    Last attempt to quit: 11/10/2014    Years since quitting: 3.0  . Smokeless tobacco: Never Used  Substance and Sexual Activity  . Alcohol use: Yes    Alcohol/week: 3.0 - 4.2 oz    Types: 5 -  7 Standard drinks or equivalent per week    Comment: 3 or 4 times a week  . Drug use: No  . Sexual activity: Not on file

## 2018-03-18 DIAGNOSIS — Z23 Encounter for immunization: Secondary | ICD-10-CM | POA: Diagnosis not present

## 2018-06-19 ENCOUNTER — Other Ambulatory Visit: Payer: 59

## 2018-06-19 DIAGNOSIS — R739 Hyperglycemia, unspecified: Secondary | ICD-10-CM | POA: Diagnosis not present

## 2018-06-19 DIAGNOSIS — Z0000 Encounter for general adult medical examination without abnormal findings: Secondary | ICD-10-CM

## 2018-06-20 LAB — HEMOGLOBIN A1C
Hgb A1c MFr Bld: 5.7 % of total Hgb — ABNORMAL HIGH (ref <5.7)
Mean Plasma Glucose: 117 (calc)
eAG (mmol/L): 6.5 (calc)

## 2018-06-20 LAB — COMPLETE METABOLIC PANEL WITH GFR
AG Ratio: 2 (calc) (ref 1.0–2.5)
ALT: 16 U/L (ref 9–46)
AST: 18 U/L (ref 10–35)
Albumin: 4.5 g/dL (ref 3.6–5.1)
Alkaline phosphatase (APISO): 71 U/L (ref 40–115)
BUN: 12 mg/dL (ref 7–25)
CO2: 29 mmol/L (ref 20–32)
Calcium: 9.8 mg/dL (ref 8.6–10.3)
Chloride: 104 mmol/L (ref 98–110)
Creat: 1.09 mg/dL (ref 0.70–1.25)
GFR, Est African American: 84 mL/min/{1.73_m2} (ref >=60)
GFR, Est Non African American: 72 mL/min/{1.73_m2} (ref >=60)
Globulin: 2.2 g/dL (calc) (ref 1.9–3.7)
Glucose, Bld: 103 mg/dL — ABNORMAL HIGH (ref 65–99)
Potassium: 5 mmol/L (ref 3.5–5.3)
Sodium: 140 mmol/L (ref 135–146)
Total Bilirubin: 0.6 mg/dL (ref 0.2–1.2)
Total Protein: 6.7 g/dL (ref 6.1–8.1)

## 2018-06-20 LAB — CBC WITH DIFFERENTIAL/PLATELET
Absolute Monocytes: 386 cells/uL (ref 200–950)
Basophils Absolute: 41 cells/uL (ref 0–200)
Basophils Relative: 0.9 %
Eosinophils Absolute: 69 cells/uL (ref 15–500)
Eosinophils Relative: 1.5 %
HCT: 44.5 % (ref 38.5–50.0)
Hemoglobin: 15.4 g/dL (ref 13.2–17.1)
Lymphs Abs: 1444 cells/uL (ref 850–3900)
MCH: 31.8 pg (ref 27.0–33.0)
MCHC: 34.6 g/dL (ref 32.0–36.0)
MCV: 91.9 fL (ref 80.0–100.0)
MPV: 11.4 fL (ref 7.5–12.5)
Monocytes Relative: 8.4 %
Neutro Abs: 2659 cells/uL (ref 1500–7800)
Neutrophils Relative %: 57.8 %
Platelets: 261 10*3/uL (ref 140–400)
RBC: 4.84 10*6/uL (ref 4.20–5.80)
RDW: 12.4 % (ref 11.0–15.0)
Total Lymphocyte: 31.4 %
WBC: 4.6 10*3/uL (ref 3.8–10.8)

## 2018-06-20 LAB — LIPID PANEL
Cholesterol: 192 mg/dL (ref <200)
HDL: 77 mg/dL (ref >40)
LDL Cholesterol (Calc): 103 mg/dL (calc) — ABNORMAL HIGH
Non-HDL Cholesterol (Calc): 115 mg/dL (calc) (ref <130)
Total CHOL/HDL Ratio: 2.5 (calc) (ref <5.0)
Triglycerides: 42 mg/dL (ref <150)

## 2018-06-21 ENCOUNTER — Other Ambulatory Visit: Payer: 59

## 2018-06-25 ENCOUNTER — Ambulatory Visit (INDEPENDENT_AMBULATORY_CARE_PROVIDER_SITE_OTHER): Payer: 59 | Admitting: Internal Medicine

## 2018-06-25 ENCOUNTER — Encounter: Payer: Self-pay | Admitting: Internal Medicine

## 2018-06-25 VITALS — BP 138/80 | HR 67 | Temp 98.2°F | Ht 70.0 in | Wt 187.0 lb

## 2018-06-25 DIAGNOSIS — R739 Hyperglycemia, unspecified: Secondary | ICD-10-CM

## 2018-06-25 DIAGNOSIS — N400 Enlarged prostate without lower urinary tract symptoms: Secondary | ICD-10-CM

## 2018-06-25 DIAGNOSIS — E785 Hyperlipidemia, unspecified: Secondary | ICD-10-CM

## 2018-06-25 DIAGNOSIS — Z0000 Encounter for general adult medical examination without abnormal findings: Secondary | ICD-10-CM | POA: Diagnosis not present

## 2018-06-25 DIAGNOSIS — D126 Benign neoplasm of colon, unspecified: Secondary | ICD-10-CM | POA: Insufficient documentation

## 2018-06-25 DIAGNOSIS — S82143S Displaced bicondylar fracture of unspecified tibia, sequela: Secondary | ICD-10-CM

## 2018-06-25 NOTE — Progress Notes (Signed)
Provider:  Rexene Edison. Mariea Clonts, D.O., C.M.D. Location:   [facility]   Place of Service:   office  Previous PCP: Gayland Curry, DO Patient Care Team: Gayland Curry, DO as PCP - General (Geriatric Medicine)  Extended Emergency Contact Information Primary Emergency Contact: Rodman Pickle Address: [address]          [place] [address] [place] Phone: [phone] Relation: Spouse  Goals of Care: Advanced Directive information Advanced Directives 10/01/2015  Does Patient Have a Medical Advance Directive? No  Would patient like information on creating a medical advance directive? No - patient declined information   Chief Complaint  Patient presents with  . Annual Exam    CPE    HPI: Patient is a 63 y.o. Hickman seen today for an annual physical exam.  Doing well, feeling pretty good.  No concerns and knows what he needs to do, just has to step up and do it.  He admits he's left the exercise slip lately.    Last year, he got a stress fracture in his knee in June--he was not to walk or significantly weight bear for 8 wks.  He fully recovered from the fx.  He'd stepped off a boat dock.  No meniscal tears identified either.  He has not been taking calcium with D.     He had a colonoscopy.  He had two polyps removed.  He has 5 year f/u.   He had some oral surgery done.    He's not pursued shingrix yet.  [facility]'s [facility] waiting list but has not heard.    Past Medical History:  Diagnosis Date  . Allergy   . Anxiety   . BPH (benign prostatic hyperplasia)   . Depression   . Diverticulosis   . Heart murmur   . Hyperlipidemia   . Mitral valve prolapse    Past Surgical History:  Procedure Laterality Date  . WISDOM TOOTH EXTRACTION      reports that he quit smoking about 3 years ago. He has never used smokeless tobacco. He reports current alcohol use of about 5.0 - 7.0 standard drinks of alcohol per week. He reports that he does not use drugs.  Functional  Status Survey:    Family History  Problem Relation Age of Onset  . Stroke Father   . Colon cancer Father   . Diabetes Mother   . Dementia Mother   . Colon cancer Paternal Aunt   . Esophageal cancer Neg Hx   . Liver cancer Neg Hx   . Pancreatic cancer Neg Hx   . Rectal cancer Neg Hx   . Stomach cancer Neg Hx     Health Maintenance  Topic Date Due  . TETANUS/TDAP  03/23/2025  . COLONOSCOPY  08/10/2027  . INFLUENZA VACCINE  Completed  . Hepatitis C Screening  Completed  . HIV Screening  Completed    No Known Allergies  Outpatient Encounter Medications as of 06/25/2018  Medication Sig  . cetirizine (ZYRTEC) 10 MG tablet Take 10 mg by mouth daily.  . [DISCONTINUED] 0.9 %  sodium chloride infusion    No facility-administered encounter medications on file as of 06/25/2018.     Review of Systems  Constitutional: Negative for chills, fever and malaise/fatigue.  HENT: Positive for hearing loss.   Eyes: Negative for blurred vision.       Glasses  Respiratory: Negative for cough and shortness of breath.   Cardiovascular: Negative for chest pain, palpitations  and leg swelling.  Gastrointestinal: Negative for abdominal pain, blood in stool, constipation, melena, nausea and vomiting.  Genitourinary: Negative for dysuria, frequency and urgency.  Musculoskeletal: Negative for back pain, falls and joint pain.  Skin: Negative for itching and rash.  Neurological: Negative for dizziness and loss of consciousness.  Endo/Heme/Allergies: Does not bruise/bleed easily.  Psychiatric/Behavioral: Negative for depression and memory loss. The patient is not nervous/anxious and does not have insomnia.     Vitals:   06/25/18 0901  BP: 138/80  Pulse: 67  Temp: 98.2 F (36.8 C)  TempSrc: Oral  SpO2: 98%  Weight: 187 lb (84.8 kg)  Height: 5\' 10"  (1.778 m)   Body mass index is 26.83 kg/m. Physical Exam Vitals signs reviewed.  Constitutional:      General: He is not in acute distress.     Appearance: Normal appearance. He is normal weight. He is not toxic-appearing.  HENT:     Head: Normocephalic and atraumatic.     Right Ear: Tympanic membrane, ear canal and external ear normal.     Left Ear: Tympanic membrane, ear canal and external ear normal.     Nose: Nose normal.     Mouth/Throat:     Mouth: Mucous membranes are moist.     Pharynx: Oropharynx is clear.  Eyes:     Extraocular Movements: Extraocular movements intact.     Conjunctiva/sclera: Conjunctivae normal.     Pupils: Pupils are equal, round, and reactive to light.  Cardiovascular:     Rate and Rhythm: Normal rate and regular rhythm.     Pulses: Normal pulses.     Heart sounds: Normal heart sounds.     Comments: Midsystolic click Pulmonary:     Effort: Pulmonary effort is normal. No respiratory distress.     Breath sounds: Normal breath sounds. No rhonchi.  Abdominal:     General: Bowel sounds are normal. There is no distension.     Palpations: Abdomen is soft. There is no mass.     Tenderness: There is no abdominal tenderness. There is no guarding or rebound.  Musculoskeletal: Normal range of motion.        General: No swelling, tenderness or deformity.     Right lower leg: No edema.     Left lower leg: No edema.  Skin:    General: Skin is warm and dry.     Capillary Refill: Capillary refill takes less than 2 seconds.  Neurological:     General: No focal deficit present.     Mental Status: He is alert and oriented to person, place, and time.     Cranial Nerves: No cranial nerve deficit.     Motor: No weakness.     Gait: Gait normal.     Deep Tendon Reflexes: Reflexes normal.  Psychiatric:        Mood and Affect: Mood normal.        Behavior: Behavior normal.        Thought Content: Thought content normal.        Judgment: Judgment normal.     Labs reviewed: Basic Metabolic Panel: Recent Labs    06/19/18 0818  NA 140  K 5.0  CL 104  CO2 29  GLUCOSE 103*  BUN 12  CREATININE 1.09    CALCIUM 9.8   Liver Function Tests: Recent Labs    06/19/18 0818  AST 18  ALT 16  BILITOT 0.6  PROT 6.7   No results for input(s): LIPASE, AMYLASE in  the last 8760 hours. No results for input(s): AMMONIA in the last 8760 hours. CBC: Recent Labs    06/19/18 0818  WBC 4.6  NEUTROABS 2,659  HGB 15.4  HCT Carlos.5  MCV 91.9  PLT 261   Cardiac Enzymes: No results for input(s): CKTOTAL, CKMB, CKMBINDEX, TROPONINI in the last 8760 hours. BNP: Invalid input(s): POCBNP Lab Results  Component Value Date   HGBA1C 5.7 (H) 06/19/2018   No results found for: TSH No results found for: VITAMINB12 No results found for: FOLATE No results found for: IRON, TIBC, FERRITIN   Assessment/Plan 1. Annual physical exam -performed today - CBC with Differential/Platelet; Future - COMPLETE METABOLIC PANEL WITH GFR; Future - Hemoglobin A1c; Future - Lipid panel; Future  2. Posterior tibial plateau fracture, unspecified laterality, sequela -resolved, encouraged him to return to cycling or walking now as hba1c and lipids have trended up  3. Benign prostatic hyperplasia without lower urinary tract symptoms -stable, still not LUTS  4. Hyperglycemia - has trended up--encouraged diet and exercise--he's well aware he needs to change this - Hemoglobin A1c; Future  5. Hyperlipidemia LDL goal <100 -LDL has trended up over 100--needs to get down with diet and resuming exercise - Lipid panel; Future  6. Tubular adenoma of colon -removed at cscope last year; needs to have q 5 yr cscopes now with Dr. Henrene Pastor  Labs/tests ordered:   Orders Placed This Encounter  Procedures  . CBC with Differential/Platelet    Standing Status:   Future    Standing Expiration Date:   06/26/2019  . COMPLETE METABOLIC PANEL WITH GFR    Standing Status:   Future    Standing Expiration Date:   06/26/2019  . Hemoglobin A1c    Standing Status:   Future    Standing Expiration Date:   06/26/2019  . Lipid panel    Standing  Status:   Future    Standing Expiration Date:   06/26/2019   F/u in 1 year for CPE, fasting labs before  Instructions given as follows:  I  recommend you take caltrate with D 600mg /400units twice a day (or two tablets at once) and 2000 units of Vitamin D3 daily.  Return to some walking or cycling for weightbearing exercise.   Check on your shingles vaccines.    I suggest you get a baseline hearing test due to some concerns when there is ambient noise.  Get back to some regular exercise either cycling or walking.    Viviano Bir L. Inaya Gillham, D.O. [facility] [address], [place] [address] Cell Phone (Mon-Fri 8am-5pm):  [phone] On Call:  [phone] & follow prompts after 5pm & weekends Office Phone:  [phone] Office Fax:  [phone]

## 2018-06-25 NOTE — Patient Instructions (Addendum)
I recommend you take caltrate with D 600mg /400units twice a day (or two tablets at once) and 2000 units of Vitamin D3 daily.  Return to some walking or cycling for weightbearing exercise.    Check on your shingles vaccines.    I suggest you get a baseline hearing test due to some concerns when there is ambient noise.  Get back to some regular exercise either cycling or walking.

## 2018-12-14 ENCOUNTER — Encounter: Payer: Self-pay | Admitting: Internal Medicine

## 2018-12-17 ENCOUNTER — Other Ambulatory Visit: Payer: Self-pay | Admitting: *Deleted

## 2018-12-17 DIAGNOSIS — Z20822 Contact with and (suspected) exposure to covid-19: Secondary | ICD-10-CM

## 2018-12-30 IMAGING — MR MR KNEE*L* W/O CM
4 of 7 series · 19 of 40 positions shown · non-contrast
Comparison: Radiograph 10/31/2017

CLINICAL DATA: Left lateral knee pain and swelling since
10/22/2017. Injured knee while stepping off of a dock onto boat.

EXAM:
MRI OF THE LEFT KNEE WITHOUT CONTRAST
TECHNIQUE: Multiplanar, multisequence MR imaging of the knee was performed. No
intravenous contrast was administered.

[Series 3: PD fat-sat · axial · 4.0mm · 0.31mm/px · z∈[-90,+15]mm · 5 of 25 slices shown (1 of 4)]
[im 1/25]
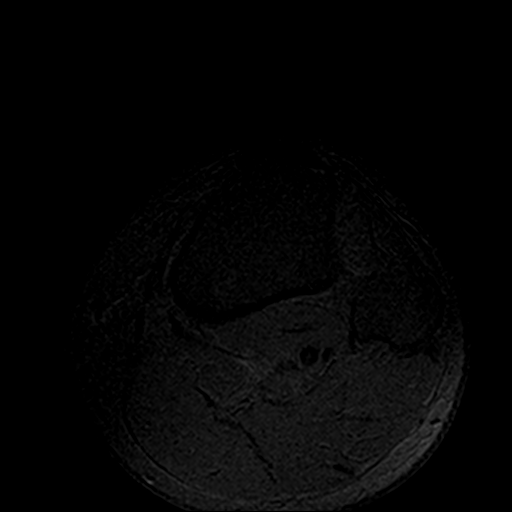
[im 7/25]
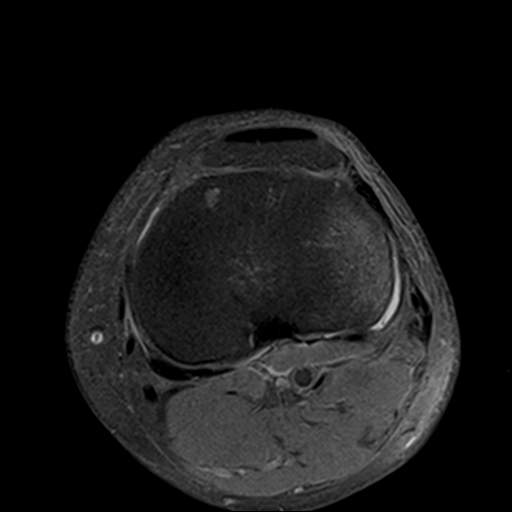
[im 13/25]
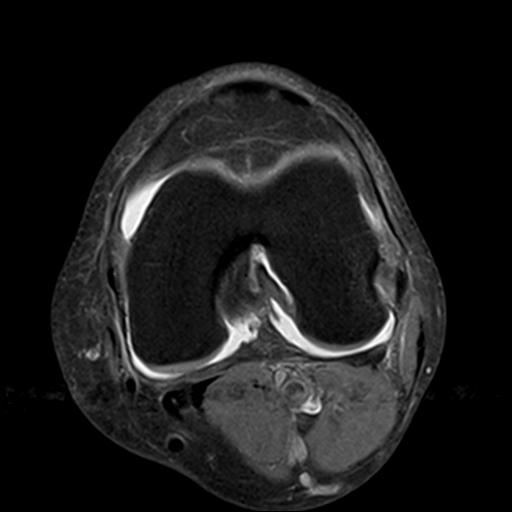
[im 19/25]
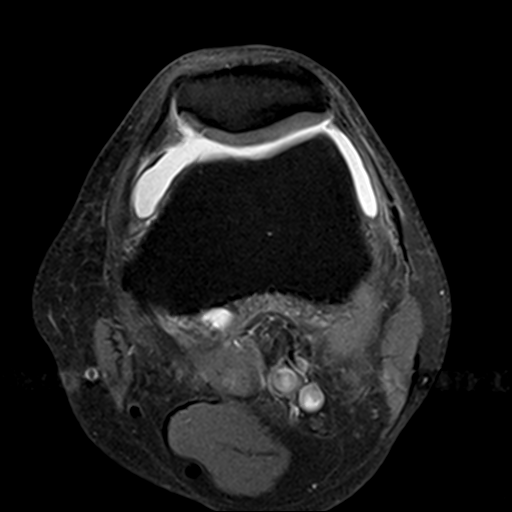
[im 25/25]
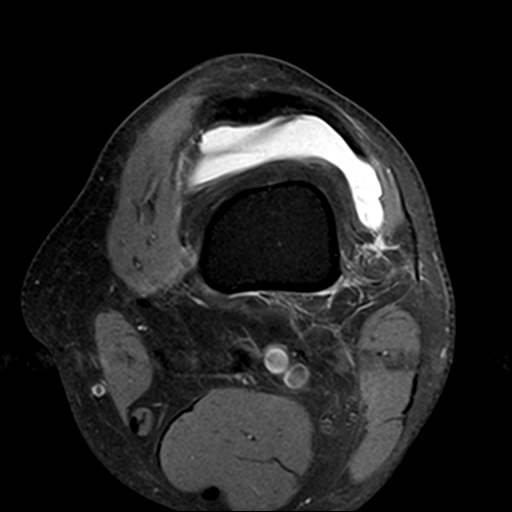

[Series 6: PD fat-sat · sagittal · 4.0mm · 0.31mm/px · 6 of 22 slices shown (2 of 4)]
[im 1/22]
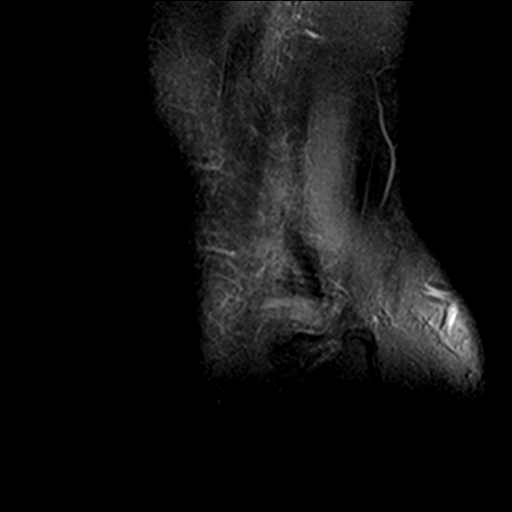
[im 5/22]
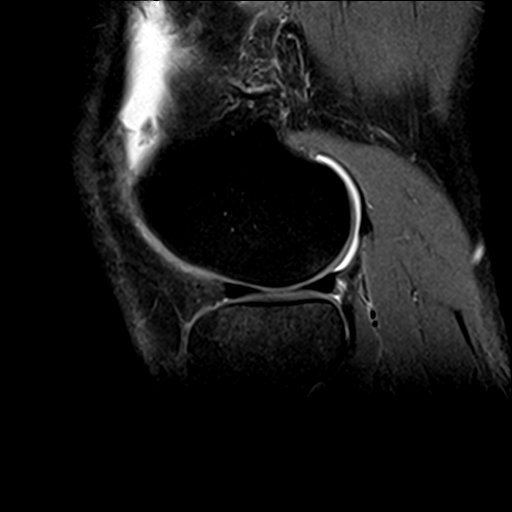
[im 9/22]
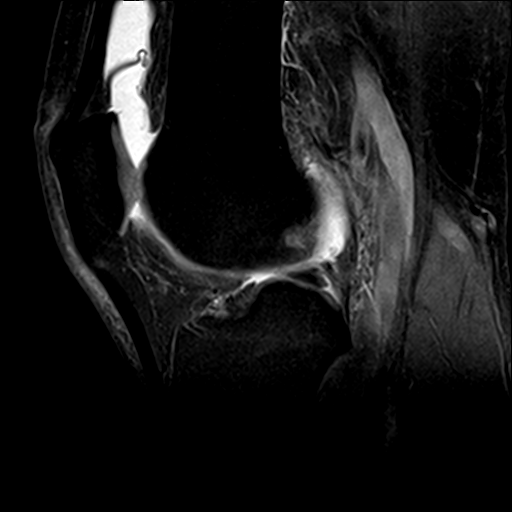
[im 13/22]
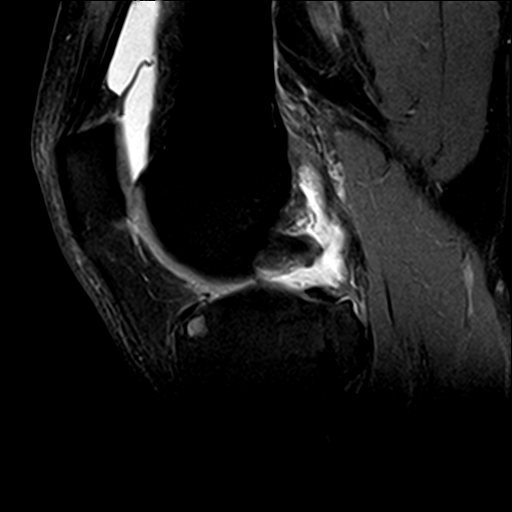
[im 17/22]
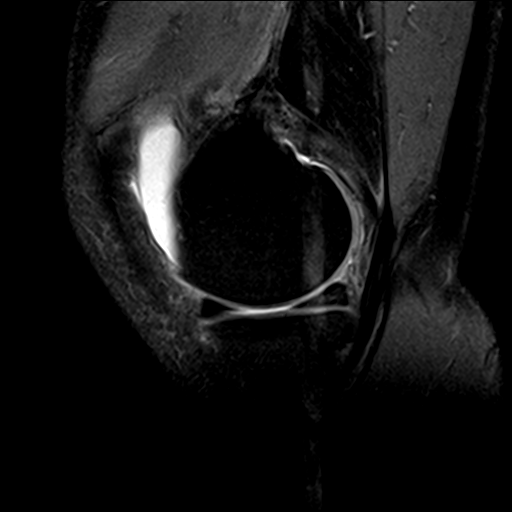
[im 22/22]
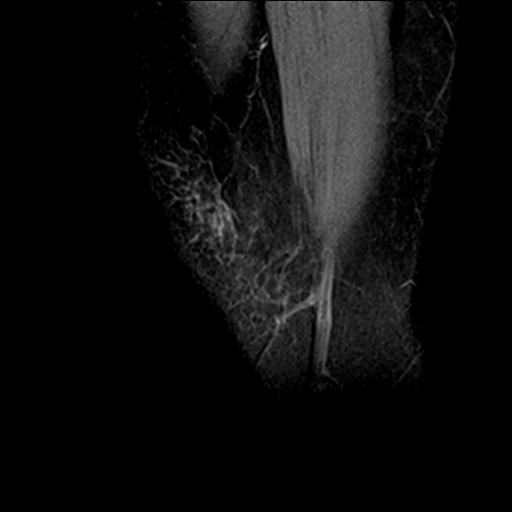

[Series 7: PD fat-sat · coronal · 4.0mm · 0.31mm/px · 5 of 26 slices shown (3 of 4)]
[im 1/26]
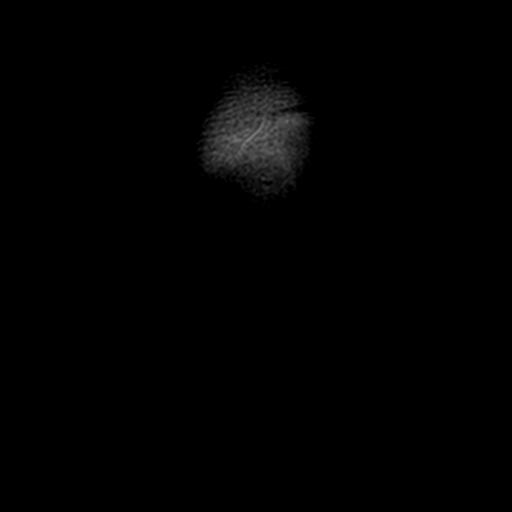
[im 5/26]
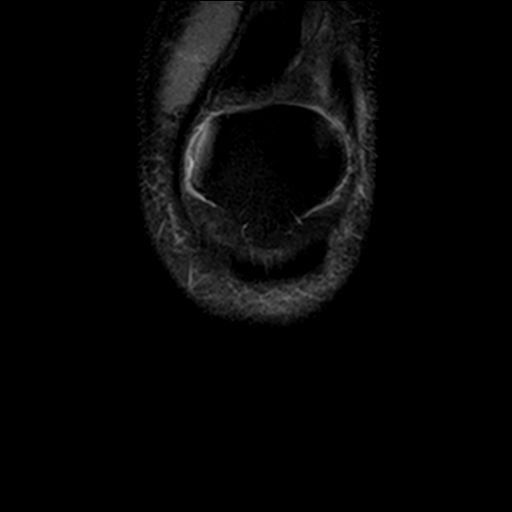
[im 9/26]
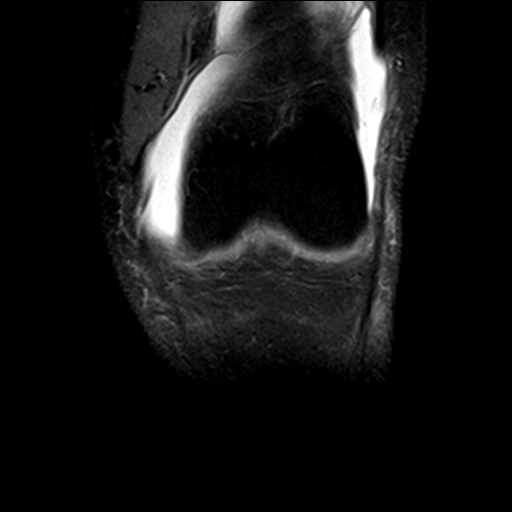
[im 13/26]
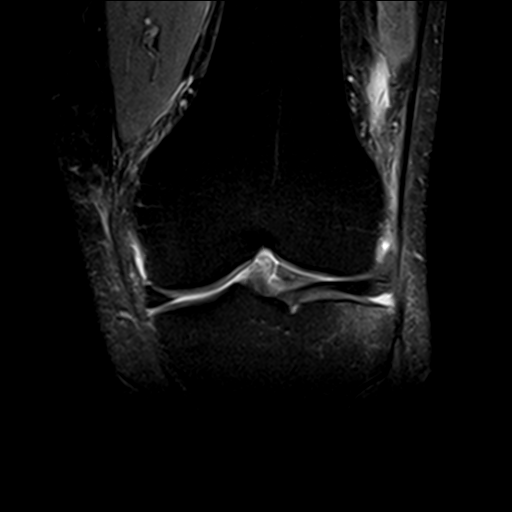
[im 21/26]
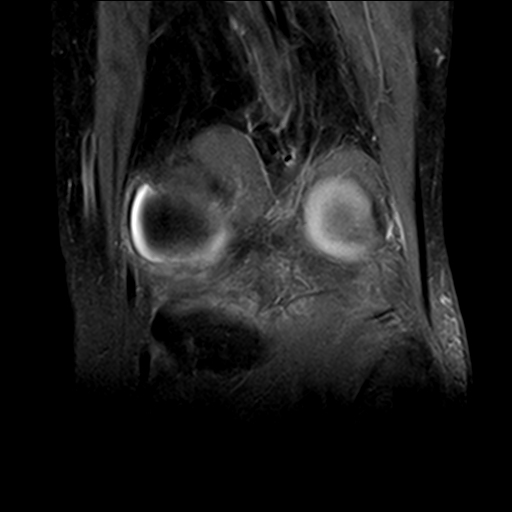

[Series 8: PD fat-sat · sagittal · 4.0mm · 0.31mm/px · 3 of 22 slices shown (4 of 4)]
[im 5/22]
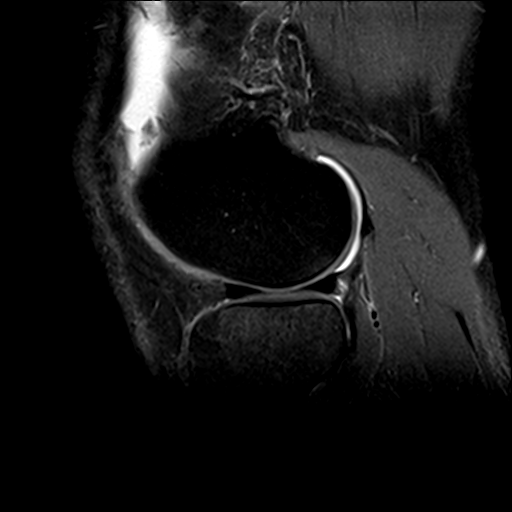
[im 13/22]
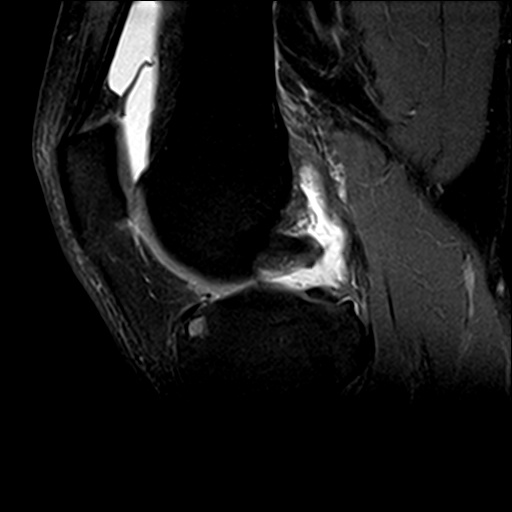
[im 22/22]
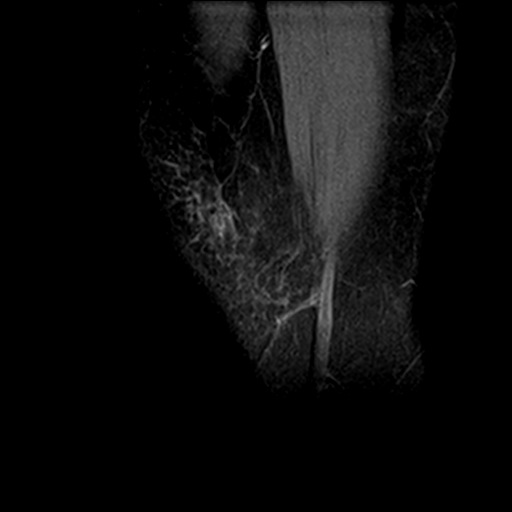

[19 of 40 positions shown; findings below may reference images not displayed]

FINDINGS: MENISCI

Medial meniscus:  Intact

Lateral meniscus:  Intact

LIGAMENTS

Cruciates:  Intact

Collaterals:  Intact

CARTILAGE

Patellofemoral: Minimal degenerative chondrosis with mild chondral
fissuring but no cartilage thinning or defects.

Medial: Moderate degenerative chondrosis. There is a small full or
near full-thickness cartilage defect involving the medial femoral
condyle articular cartilage measuring 5 mm.

Lateral: Moderate degenerative chondrosis. Small full-thickness
cartilage defect involving the lateral femoral condyle measuring 5
mm.

Joint: Moderate-sized joint effusion. Superior and medial patellar
plica are noted.

Popliteal Fossa:  No popliteal mass or Baker's cyst.

Extensor Mechanism: The patella retinacular structures are intact
and the quadriceps and patellar tendons are intact. Mild proximal
patellar tendinopathy.

Bones: Small subchondral impaction type fracture involving the
lateral femoral condyle with small defect in the subchondral plate
and moderate marrow edema. No depression.

Small subchondral cyst noted in the anterior aspect of the medial
tibial plateau.

Other: Normal knee musculature.
IMPRESSION: 1. Small subchondral impaction type fracture involving the lateral
tibial plateau with small defect in the subchondral plate. No
depression.
2. Intact ligamentous structures and no meniscal tears.
3. Tricompartmental degenerative chondrosis.
4. Moderate-sized joint effusion with superior and medial patellar
plica.

## 2019-06-04 ENCOUNTER — Other Ambulatory Visit: Payer: Self-pay

## 2019-06-24 ENCOUNTER — Other Ambulatory Visit: Payer: 59

## 2019-06-24 ENCOUNTER — Other Ambulatory Visit: Payer: Self-pay

## 2019-06-24 DIAGNOSIS — E785 Hyperlipidemia, unspecified: Secondary | ICD-10-CM

## 2019-06-24 DIAGNOSIS — Z0000 Encounter for general adult medical examination without abnormal findings: Secondary | ICD-10-CM

## 2019-06-24 DIAGNOSIS — R739 Hyperglycemia, unspecified: Secondary | ICD-10-CM

## 2019-06-25 LAB — CBC WITH DIFFERENTIAL/PLATELET
Absolute Monocytes: 490 cells/uL (ref 200–950)
Basophils Absolute: 50 cells/uL (ref 0–200)
Basophils Relative: 0.9 %
Eosinophils Absolute: 88 cells/uL (ref 15–500)
Eosinophils Relative: 1.6 %
HCT: 43.8 % (ref 38.5–50.0)
Hemoglobin: 14.9 g/dL (ref 13.2–17.1)
Lymphs Abs: 1958 cells/uL (ref 850–3900)
MCH: 31.7 pg (ref 27.0–33.0)
MCHC: 34 g/dL (ref 32.0–36.0)
MCV: 93.2 fL (ref 80.0–100.0)
MPV: 11.9 fL (ref 7.5–12.5)
Monocytes Relative: 8.9 %
Neutro Abs: 2915 cells/uL (ref 1500–7800)
Neutrophils Relative %: 53 %
Platelets: 227 10*3/uL (ref 140–400)
RBC: 4.7 10*6/uL (ref 4.20–5.80)
RDW: 11.8 % (ref 11.0–15.0)
Total Lymphocyte: 35.6 %
WBC: 5.5 10*3/uL (ref 3.8–10.8)

## 2019-06-25 LAB — COMPLETE METABOLIC PANEL WITH GFR
AG Ratio: 2.1 (calc) (ref 1.0–2.5)
ALT: 15 U/L (ref 9–46)
AST: 17 U/L (ref 10–35)
Albumin: 4.4 g/dL (ref 3.6–5.1)
Alkaline phosphatase (APISO): 58 U/L (ref 35–144)
BUN: 13 mg/dL (ref 7–25)
CO2: 28 mmol/L (ref 20–32)
Calcium: 9.5 mg/dL (ref 8.6–10.3)
Chloride: 105 mmol/L (ref 98–110)
Creat: 1.05 mg/dL (ref 0.70–1.25)
GFR, Est African American: 87 mL/min/{1.73_m2} (ref >=60)
GFR, Est Non African American: 75 mL/min/{1.73_m2} (ref >=60)
Globulin: 2.1 g/dL (calc) (ref 1.9–3.7)
Glucose, Bld: 104 mg/dL — ABNORMAL HIGH (ref 65–99)
Potassium: 4.4 mmol/L (ref 3.5–5.3)
Sodium: 140 mmol/L (ref 135–146)
Total Bilirubin: 0.6 mg/dL (ref 0.2–1.2)
Total Protein: 6.5 g/dL (ref 6.1–8.1)

## 2019-06-25 LAB — HEMOGLOBIN A1C
Hgb A1c MFr Bld: 5.8 % of total Hgb — ABNORMAL HIGH (ref <5.7)
Mean Plasma Glucose: 120 (calc)
eAG (mmol/L): 6.6 (calc)

## 2019-06-25 LAB — LIPID PANEL
Cholesterol: 201 mg/dL — ABNORMAL HIGH (ref <200)
HDL: 79 mg/dL (ref >=40)
LDL Cholesterol (Calc): 109 mg/dL (calc) — ABNORMAL HIGH
Non-HDL Cholesterol (Calc): 122 mg/dL (calc) (ref <130)
Total CHOL/HDL Ratio: 2.5 (calc) (ref <5.0)
Triglycerides: 50 mg/dL (ref <150)

## 2019-06-25 NOTE — Progress Notes (Signed)
Slight increase in bad cholesterol and hba1c since last time.  Other labs are all ok.  We'll discuss at his visit.

## 2019-07-01 ENCOUNTER — Other Ambulatory Visit: Payer: Self-pay

## 2019-07-01 ENCOUNTER — Telehealth: Payer: Self-pay

## 2019-07-01 ENCOUNTER — Encounter: Payer: Self-pay | Admitting: Internal Medicine

## 2019-07-01 ENCOUNTER — Ambulatory Visit (INDEPENDENT_AMBULATORY_CARE_PROVIDER_SITE_OTHER): Payer: 59 | Admitting: Internal Medicine

## 2019-07-01 VITALS — BP 138/82 | HR 62 | Temp 97.5°F | Ht 70.0 in | Wt 186.6 lb

## 2019-07-01 DIAGNOSIS — I341 Nonrheumatic mitral (valve) prolapse: Secondary | ICD-10-CM

## 2019-07-01 DIAGNOSIS — R739 Hyperglycemia, unspecified: Secondary | ICD-10-CM | POA: Diagnosis not present

## 2019-07-01 DIAGNOSIS — Z0000 Encounter for general adult medical examination without abnormal findings: Secondary | ICD-10-CM | POA: Diagnosis not present

## 2019-07-01 DIAGNOSIS — S82143S Displaced bicondylar fracture of unspecified tibia, sequela: Secondary | ICD-10-CM

## 2019-07-01 DIAGNOSIS — N400 Enlarged prostate without lower urinary tract symptoms: Secondary | ICD-10-CM

## 2019-07-01 DIAGNOSIS — E785 Hyperlipidemia, unspecified: Secondary | ICD-10-CM | POA: Diagnosis not present

## 2019-07-01 MED ORDER — CALTRATE 600+D3 600-800 MG-UNIT PO TABS: 1.0000 | ORAL_TABLET | Freq: Every day | ORAL | 11 refills | Status: AC

## 2019-07-01 NOTE — Progress Notes (Signed)
Location:  [facility] clinic  Provider: Dr. Hollace Kinnier  Goals of Care:  Advanced Directives 07/01/2019  Does Patient Have a Medical Advance Directive? Yes  Does patient want to make changes to medical advance directive? No - Patient declined  Would patient like information on creating a medical advance directive? -     Chief Complaint  Patient presents with  . Annual Exam    Annual physical and lab results     HPI: Patient is a 64 y.o. male seen today for medical management of chronic diseases.    Labs reviewed with patient.   He is interested in the Shingles vaccine. [facility] and [facility] not able to get on a waiting list. They can provide the first vaccine but no the second.   Received his flu vaccine in November, 2020.   Would like to receive his covid vaccine when it is available to him.   Eye doctor was three weeks ago.   Dentist scheduled next month.          Past Medical History:  Diagnosis Date  . Allergy   . Anxiety   . BPH (benign prostatic hyperplasia)   . Depression   . Diverticulosis   . Heart murmur   . Hyperlipidemia   . Mitral valve prolapse     Past Surgical History:  Procedure Laterality Date  . WISDOM TOOTH EXTRACTION      No Known Allergies  Outpatient Encounter Medications as of 07/01/2019  Medication Sig  . calcium carbonate (OSCAL) 1500 (600 Ca) MG TABS tablet Take 600 mg of elemental calcium by mouth daily before supper.  . cetirizine (ZYRTEC) 10 MG tablet Take 10 mg by mouth as needed for allergies.  . [DISCONTINUED] cetirizine (ZYRTEC) 10 MG tablet Take 10 mg by mouth as needed.    No facility-administered encounter medications on file as of 07/01/2019.    Review of Systems:  Review of Systems  Constitutional: Negative for activity change, appetite change and fatigue.  HENT: Negative for dental problem, hearing loss and trouble swallowing.   Eyes: Negative for photophobia and visual disturbance.    Respiratory: Negative for cough, shortness of breath and wheezing.   Cardiovascular: Negative for chest pain and leg swelling.  Gastrointestinal: Negative for abdominal pain, constipation, diarrhea and nausea.  Endocrine: Negative for polydipsia, polyphagia and polyuria.  Genitourinary: Negative for dysuria, frequency and hematuria.  Musculoskeletal: Negative.   Skin: Negative.   Neurological: Negative for dizziness, weakness, numbness and headaches.  Psychiatric/Behavioral: Negative for dysphoric mood and sleep disturbance. The patient is not nervous/anxious.     Health Maintenance  Topic Date Due  . INFLUENZA VACCINE  12/22/2018  . COLONOSCOPY  08/10/2022  . TETANUS/TDAP  03/23/2025  . Hepatitis C Screening  Completed  . HIV Screening  Completed    Physical Exam: Vitals:   07/01/19 0853  BP: 138/82  Pulse: 62  Temp: (!) 97.5 F (36.4 C)  SpO2: 98%  Weight: 186 lb 9.6 oz (84.6 kg)  Height: 5\' 10"  (1.778 m)   Body mass index is 26.77 kg/m. Physical Exam Vitals reviewed.  Constitutional:      General: He is not in acute distress.    Appearance: Normal appearance. He is normal weight.  HENT:     Right Ear: There is no impacted cerumen.     Left Ear: There is no impacted cerumen.     Mouth/Throat:     Mouth: Mucous membranes are moist.  Eyes:     Extraocular Movements: Extraocular movements intact.     Pupils: Pupils are equal, round, and reactive to light.  Neck:     Thyroid: No thyroid mass, thyromegaly or thyroid tenderness.  Cardiovascular:     Rate and Rhythm: Normal rate and regular rhythm.     Heart sounds: Murmur present.     Comments: Murmur over apex. Click over pulmonic space and Erbs point.  Pulmonary:     Effort: Pulmonary effort is normal. No respiratory distress.     Breath sounds: Normal breath sounds. No wheezing.  Abdominal:     General: Abdomen is flat. Bowel sounds are normal.     Palpations: Abdomen is soft.  Musculoskeletal:     Cervical  back: Full passive range of motion without pain.     Right lower leg: No edema.     Left lower leg: No edema.  Skin:    General: Skin is warm and dry.     Capillary Refill: Capillary refill takes less than 2 seconds.  Neurological:     General: No focal deficit present.     Mental Status: He is alert and oriented to person, place, and time. Mental status is at baseline.     Sensory: No sensory deficit.     Motor: No weakness.  Psychiatric:        Mood and Affect: Mood normal.        Behavior: Behavior normal.        Thought Content: Thought content normal.        Judgment: Judgment normal.     Labs reviewed: Basic Metabolic Panel: Recent Labs    06/24/19 0807  NA 140  K 4.4  CL 105  CO2 28  GLUCOSE 104*  BUN 13  CREATININE 1.05  CALCIUM 9.5   Liver Function Tests: Recent Labs    06/24/19 0807  AST 17  ALT 15  BILITOT 0.6  PROT 6.5   No results for input(s): LIPASE, AMYLASE in the last 8760 hours. No results for input(s): AMMONIA in the last 8760 hours. CBC: Recent Labs    06/24/19 0807  WBC 5.5  NEUTROABS 2,915  HGB 14.9  HCT 43.8  MCV 93.2  PLT 227   Lipid Panel: Recent Labs    06/24/19 0807  CHOL 201*  HDL 79  LDLCALC 109*  TRIG 50  CHOLHDL 2.5   Lab Results  Component Value Date   HGBA1C 5.8 (H) 06/24/2019    Procedures since last visit: No results found.  Assessment/Plan 1. Annual physical exam - EKG, 12-LEAD- today - complete blood panel with differential/platelets- future - complete metabolic panel with GFR- future  2. Hyperglycemia - A1C elevated to 5.8/ glucose 104, suspect weight gain as contributing factor - recommend diet and exercise to promote weight loss - recommend limiting calories and eating foods  - diabetic foot exam- today - hemoglobin A1C-future  3. Hyperlipidemia LDL goal <100 - LDL elevated at 109 - suggest weight loss and dietary changes - recommend mediterranean diet with low fat foods - lipid panel -  future  4. MVP (mitral valve prolapse) - stable, click observed over pulmonary and Erbs point  5. Benign prostatic hyperplasia without lower urinary tract symptoms - stable, no reports of urinary frequency  6. Posterior tibial plateau fracture, unspecified lateriality, sequela - resolved - continue to take caltrate daily   Labs/tests ordered:  Cbc with differential/platelets, complete metabolic panel with GFR, lipid panel, hemoglobin A1C- future Next  appt:  1 year for annual exam with fasting labs

## 2019-07-01 NOTE — Patient Instructions (Signed)
Try calling a different pharmacy about getting shingrix.  You should wait at least two weeks between your shingrix and a covid vaccine.

## 2019-07-01 NOTE — Telephone Encounter (Signed)
Janus from the pharmacy is faxing a copy of Mr. Cutter flu-shot record.

## 2019-07-01 NOTE — Progress Notes (Signed)
EKG is unchanged from last EKG visible.  Has incompleted RBBB with abnormal T waves, but nothing new and this was done for purposes of annual exam and his mitral valve prolapse.

## 2020-06-29 ENCOUNTER — Other Ambulatory Visit: Payer: 59

## 2020-06-29 ENCOUNTER — Other Ambulatory Visit: Payer: Self-pay

## 2020-06-29 DIAGNOSIS — R739 Hyperglycemia, unspecified: Secondary | ICD-10-CM

## 2020-06-29 DIAGNOSIS — E785 Hyperlipidemia, unspecified: Secondary | ICD-10-CM

## 2020-06-29 DIAGNOSIS — Z0000 Encounter for general adult medical examination without abnormal findings: Secondary | ICD-10-CM

## 2020-06-30 LAB — CBC WITH DIFFERENTIAL/PLATELET
Absolute Monocytes: 439 cells/uL (ref 200–950)
Basophils Absolute: 51 cells/uL (ref 0–200)
Basophils Relative: 0.9 %
Eosinophils Absolute: 68 cells/uL (ref 15–500)
Eosinophils Relative: 1.2 %
HCT: 44.2 % (ref 38.5–50.0)
Hemoglobin: 15 g/dL (ref 13.2–17.1)
Lymphs Abs: 1716 cells/uL (ref 850–3900)
MCH: 32.1 pg (ref 27.0–33.0)
MCHC: 33.9 g/dL (ref 32.0–36.0)
MCV: 94.4 fL (ref 80.0–100.0)
MPV: 11.7 fL (ref 7.5–12.5)
Monocytes Relative: 7.7 %
Neutro Abs: 3426 cells/uL (ref 1500–7800)
Neutrophils Relative %: 60.1 %
Platelets: 252 10*3/uL (ref 140–400)
RBC: 4.68 10*6/uL (ref 4.20–5.80)
RDW: 12.2 % (ref 11.0–15.0)
Total Lymphocyte: 30.1 %
WBC: 5.7 10*3/uL (ref 3.8–10.8)

## 2020-06-30 LAB — COMPLETE METABOLIC PANEL WITH GFR
AG Ratio: 1.8 (calc) (ref 1.0–2.5)
ALT: 15 U/L (ref 9–46)
AST: 20 U/L (ref 10–35)
Albumin: 4.4 g/dL (ref 3.6–5.1)
Alkaline phosphatase (APISO): 71 U/L (ref 35–144)
BUN: 14 mg/dL (ref 7–25)
CO2: 32 mmol/L (ref 20–32)
Calcium: 9.7 mg/dL (ref 8.6–10.3)
Chloride: 103 mmol/L (ref 98–110)
Creat: 1 mg/dL (ref 0.70–1.25)
GFR, Est African American: 92 mL/min/{1.73_m2} (ref >=60)
GFR, Est Non African American: 79 mL/min/{1.73_m2} (ref >=60)
Globulin: 2.5 g/dL (calc) (ref 1.9–3.7)
Glucose, Bld: 77 mg/dL (ref 65–99)
Potassium: 4.6 mmol/L (ref 3.5–5.3)
Sodium: 141 mmol/L (ref 135–146)
Total Bilirubin: 0.4 mg/dL (ref 0.2–1.2)
Total Protein: 6.9 g/dL (ref 6.1–8.1)

## 2020-06-30 LAB — HEMOGLOBIN A1C
Hgb A1c MFr Bld: 5.5 % of total Hgb (ref <5.7)
Mean Plasma Glucose: 111 mg/dL
eAG (mmol/L): 6.2 mmol/L

## 2020-06-30 LAB — LIPID PANEL
Cholesterol: 198 mg/dL (ref <200)
HDL: 86 mg/dL (ref >=40)
LDL Cholesterol (Calc): 99 mg/dL (calc)
Non-HDL Cholesterol (Calc): 112 mg/dL (calc) (ref <130)
Total CHOL/HDL Ratio: 2.3 (calc) (ref <5.0)
Triglycerides: 43 mg/dL (ref <150)

## 2020-06-30 NOTE — Progress Notes (Signed)
Cholesterol and blood sugar are both improved to goal/normal!  :)   Blood counts, liver, kidneys and electrolytes are all normal.

## 2020-07-02 ENCOUNTER — Ambulatory Visit (INDEPENDENT_AMBULATORY_CARE_PROVIDER_SITE_OTHER): Payer: 59 | Admitting: Internal Medicine

## 2020-07-02 ENCOUNTER — Encounter: Payer: Self-pay | Admitting: Internal Medicine

## 2020-07-02 ENCOUNTER — Other Ambulatory Visit: Payer: Self-pay

## 2020-07-02 VITALS — BP 128/84 | HR 53 | Temp 97.5°F | Ht 70.0 in | Wt 174.2 lb

## 2020-07-02 DIAGNOSIS — R55 Syncope and collapse: Secondary | ICD-10-CM | POA: Diagnosis not present

## 2020-07-02 DIAGNOSIS — Z0000 Encounter for general adult medical examination without abnormal findings: Secondary | ICD-10-CM

## 2020-07-02 DIAGNOSIS — E785 Hyperlipidemia, unspecified: Secondary | ICD-10-CM

## 2020-07-02 DIAGNOSIS — I341 Nonrheumatic mitral (valve) prolapse: Secondary | ICD-10-CM

## 2020-07-02 DIAGNOSIS — R739 Hyperglycemia, unspecified: Secondary | ICD-10-CM

## 2020-07-02 DIAGNOSIS — N400 Enlarged prostate without lower urinary tract symptoms: Secondary | ICD-10-CM

## 2020-07-02 NOTE — Progress Notes (Signed)
Provider:  Rexene Edison. Mariea Clonts, D.O., C.M.D. Location:   [place]  Place of Service:   clinic  Previous PCP: Gayland Curry, DO Patient Care Team: Gayland Curry, DO as PCP - General (Geriatric Medicine)  Extended Emergency Contact Information Primary Emergency Contact: Rodman Pickle Address: [address]          [place] [address] [place] Phone: [phone] Relation: Spouse  Goals of Care: Advanced Directive information Advanced Directives 07/02/2020  Does Patient Have a Medical Advance Directive? Yes  Type of Advance Directive Living will  Does patient want to make changes to medical advance directive? No - Patient declined  Would patient like information on creating a medical advance directive? -   Chief Complaint  Patient presents with  . Annual Exam    Annual Exam     HPI: Patient is a 65 y.o. Hickman seen today for an annual physical exam.    He has a PMH of MVP, BPH w/o symptoms, hyperlipidemia, hyperglycemia and colon polyp.    A couple of months ago, he was sitting on the couch first thing in the am drinking coffee.  He drank a big gulp of coffee and apparently passed out.  He felt very flushed after swallowing, passed out just a few seconds, had no after effects.  No chest pain, palpitations, shortness of breath.    He has retired (end of march last year).  It's normalized his exercise schedule, sleep patterns, etc.  Numbers and weight improved.  Riding his bicycle an hour a day with goal of 5000 miles for the year.   Past Medical History:  Diagnosis Date  . Allergy   . Anxiety   . BPH (benign prostatic hyperplasia)   . Depression   . Diverticulosis   . Heart murmur   . Hyperlipidemia   . Mitral valve prolapse    Past Surgical History:  Procedure Laterality Date  . WISDOM TOOTH EXTRACTION      reports that he quit smoking about 5 years ago. He has never used smokeless tobacco. He reports current alcohol use of about 5.0 - 7.0  standard drinks of alcohol per week. He reports that he does not use drugs.  Functional Status Survey:    Family History  Problem Relation Age of Onset  . Stroke Father   . Colon cancer Father   . Diabetes Mother   . Dementia Mother   . Colon cancer Paternal Aunt   . Esophageal cancer Neg Hx   . Liver cancer Neg Hx   . Pancreatic cancer Neg Hx   . Rectal cancer Neg Hx   . Stomach cancer Neg Hx     Health Maintenance  Topic Date Due  . COVID-19 Vaccine (1) Never done  . COLONOSCOPY (Pts 45-92yrs Insurance coverage will need to be confirmed)  08/10/2022  . TETANUS/TDAP  03/23/2025  . INFLUENZA VACCINE  Completed  . Hepatitis C Screening  Completed  . HIV Screening  Completed    No Known Allergies  Outpatient Encounter Medications as of 07/02/2020  Medication Sig  . Calcium Carb-Cholecalciferol (CALTRATE 600+D3) 600-800 MG-UNIT TABS Take 1 tablet by mouth daily.  . cetirizine (ZYRTEC) 10 MG tablet Take 10 mg by mouth as needed for allergies.   No facility-administered encounter medications on file as of 07/02/2020.    Review of Systems  Constitutional: Negative for chills and fever.  HENT: Negative for congestion and sore throat.   Eyes: Negative for blurred vision.  Respiratory: Negative for cough and shortness of breath.   Cardiovascular: Negative for chest pain, palpitations, orthopnea, leg swelling and PND.  Gastrointestinal: Negative for abdominal pain, blood in stool, constipation and melena.  Genitourinary: Negative for dysuria, frequency and urgency.  Musculoskeletal: Negative for falls and joint pain.  Skin: Negative for itching and rash.  Neurological: Positive for loss of consciousness. Negative for dizziness, seizures and weakness.  Psychiatric/Behavioral: Negative for depression and memory loss. The patient is not nervous/anxious and does not have insomnia.     Vitals:   07/02/20 0739  BP: 128/84  Pulse: (!) 53  Temp: (!) 97.5 F (36.4 C)  SpO2: 98%   Weight: 174 lb 3.2 oz (79 kg)  Height: 5\' 10"  (1.778 m)   Body mass index is 25 kg/m. Physical Exam Vitals reviewed.  Constitutional:      Appearance: Normal appearance.  HENT:     Head: Normocephalic and atraumatic.     Right Ear: External ear normal.     Left Ear: External ear normal.     Nose: Nose normal.     Mouth/Throat:     Pharynx: Oropharynx is clear.  Eyes:     Extraocular Movements: Extraocular movements intact.     Conjunctiva/sclera: Conjunctivae normal.     Pupils: Pupils are equal, round, and reactive to light.  Cardiovascular:     Rate and Rhythm: Regular rhythm. Bradycardia present.     Heart sounds: No murmur heard.     Comments: Midsystolic click Pulmonary:     Effort: Pulmonary effort is normal.     Breath sounds: Normal breath sounds. No wheezing, rhonchi or rales.  Abdominal:     General: Bowel sounds are normal. There is no distension.     Palpations: Abdomen is soft.     Tenderness: There is no abdominal tenderness. There is no guarding or rebound.  Musculoskeletal:        General: Normal range of motion.     Cervical back: Neck supple.     Right lower leg: No edema.     Left lower leg: No edema.  Lymphadenopathy:     Cervical: No cervical adenopathy.  Skin:    General: Skin is warm and dry.     Comments: Raised skin tone papule on upper cervical area  Neurological:     General: No focal deficit present.     Mental Status: He is alert and oriented to person, place, and time.     Cranial Nerves: No cranial nerve deficit.     Sensory: No sensory deficit.     Motor: No weakness.     Coordination: Coordination normal.     Gait: Gait normal.     Comments: No DTRs  Psychiatric:        Mood and Affect: Mood normal.        Behavior: Behavior normal.     Labs reviewed: Basic Metabolic Panel: Recent Labs    06/29/20 0830  NA 141  K 4.6  CL 103  CO2 32  GLUCOSE 77  BUN 14  CREATININE 1.00  CALCIUM 9.7   Liver Function  Tests: Recent Labs    06/29/20 0830  AST 20  ALT 15  BILITOT 0.4  PROT 6.9   No results for input(s): LIPASE, AMYLASE in the last 8760 hours. No results for input(s): AMMONIA in the last 8760 hours. CBC: Recent Labs    06/29/20 0830  WBC 5.7  NEUTROABS 3,426  HGB 15.0  HCT 44.2  MCV 94.4  PLT 252   Cardiac Enzymes: No results for input(s): CKTOTAL, CKMB, CKMBINDEX, TROPONINI in the last 8760 hours. BNP: Invalid input(s): POCBNP Lab Results  Component Value Date   HGBA1C 5.5 06/29/2020   No results found for: TSH No results found for: VITAMINB12 No results found for: FOLATE No results found for: IRON, TIBC, FERRITIN  Assessment/Plan 1. Annual physical exam -performed today and benign  2. Vasovagal syncope -was in context of large gulp of hot coffee, short-lived -should this recur, will need full workup  3. Hyperglycemia -improved with retirement, regular cycling and healthy diet  4. Hyperlipidemia LDL goal <100 -improved with better diet and exercise since retirement  5. MVP (mitral valve prolapse) -does not have any symptoms  6. Benign prostatic hyperplasia without lower urinary tract symptoms -no new symptoms or concerns  Labs/tests ordered:  Cbc, cmp, flp in 1 yr F/u in 1 yr for CPE   Doxie Augenstein L. Arlis Yale, D.O. [facility] [address], [place] [address] Cell Phone (Mon-Fri 8am-5pm):  [phone] On Call:  [phone] & follow prompts after 5pm & weekends Office Phone:  [phone] Office Fax:  [phone]

## 2020-07-02 NOTE — Patient Instructions (Signed)
Syncope  Syncope refers to a condition in which a person temporarily loses consciousness. Syncope may also be called fainting or passing out. It is caused by a sudden decrease in blood flow to the brain. Even though most causes of syncope are not dangerous, syncope can be a sign of a serious medical problem. Your health care provider may do tests to find the reason why you are having syncope. Signs that you may be about to faint include:  Feeling dizzy or light-headed.  Feeling nauseous.  Seeing all white or all black in your field of vision.  Having cold, clammy skin. If you faint, get medical help right away. Call your local emergency services (911 in the [place] Do not drive yourself to the hospital. Follow these instructions at home: Pay attention to any changes in your symptoms. Take these actions to stay safe and to help relieve your symptoms: Lifestyle  Do not drive, use machinery, or play sports until your health care provider says it is okay.  Do not drink alcohol.  Do not use any products that contain nicotine or tobacco, such as cigarettes and e-cigarettes. If you need help quitting, ask your health care provider.  Drink enough fluid to keep your urine pale yellow. General instructions  Take over-the-counter and prescription medicines only as told by your health care provider.  If you are taking blood pressure or heart medicine, get up slowly and take several minutes to sit and then stand. This can reduce dizziness or light-headedness.  Have someone stay with you until you feel stable.  If you start to feel like you might faint, lie down right away and raise (elevate) your feet above the level of your heart. Breathe deeply and steadily. Wait until all the symptoms have passed.  Keep all follow-up visits as told by your health care provider. This is important. Get help right away if you:  Have a severe headache.  Faint once or repeatedly.  Have pain in your chest,  abdomen, or back.  Have a very fast or irregular heartbeat (palpitations).  Have pain when you breathe.  Are bleeding from your mouth or rectum, or you have black or tarry stool.  Have a seizure.  Are confused.  Have trouble walking.  Have severe weakness.  Have vision problems. These symptoms may represent a serious problem that is an emergency. Do not wait to see if your symptoms will go away. Get medical help right away. Call your local emergency services (911 in the [place] Do not drive yourself to the hospital. Summary  Syncope refers to a condition in which a person temporarily loses consciousness. It is caused by a sudden decrease in blood flow to the brain.  Signs that you may be about to faint include dizziness, feeling light-headed, feeling nauseous, sudden vision changes, or cold, clammy skin.  Although most causes of syncope are not dangerous, syncope can be a sign of a serious medical problem. If you faint, get medical help right away. This information is not intended to replace advice given to you by your health care provider. Make sure you discuss any questions you have with your health care provider. Document Revised: 09/19/2019 Document Reviewed: 09/19/2019 Elsevier Patient Education  2021 Elsevier Inc.  

## 2020-07-13 ENCOUNTER — Encounter: Payer: Self-pay | Admitting: Internal Medicine

## 2020-08-06 ENCOUNTER — Encounter: Payer: Self-pay | Admitting: Internal Medicine

## 2021-02-10 ENCOUNTER — Encounter: Payer: Self-pay | Admitting: Internal Medicine

## 2021-03-22 ENCOUNTER — Ambulatory Visit (INDEPENDENT_AMBULATORY_CARE_PROVIDER_SITE_OTHER): Payer: Medicare (Managed Care) | Admitting: Nurse Practitioner

## 2021-03-22 ENCOUNTER — Encounter: Payer: Self-pay | Admitting: Nurse Practitioner

## 2021-03-22 ENCOUNTER — Other Ambulatory Visit: Payer: Self-pay

## 2021-03-22 VITALS — BP 128/80 | HR 86 | Temp 97.1°F | Ht 72.0 in | Wt 171.0 lb

## 2021-03-22 DIAGNOSIS — R739 Hyperglycemia, unspecified: Secondary | ICD-10-CM

## 2021-03-22 DIAGNOSIS — I341 Nonrheumatic mitral (valve) prolapse: Secondary | ICD-10-CM

## 2021-03-22 DIAGNOSIS — E785 Hyperlipidemia, unspecified: Secondary | ICD-10-CM

## 2021-03-22 DIAGNOSIS — N400 Enlarged prostate without lower urinary tract symptoms: Secondary | ICD-10-CM

## 2021-03-22 DIAGNOSIS — Z23 Encounter for immunization: Secondary | ICD-10-CM | POA: Diagnosis not present

## 2021-03-22 DIAGNOSIS — M25511 Pain in right shoulder: Secondary | ICD-10-CM

## 2021-03-22 NOTE — Patient Instructions (Addendum)
Please schedule physical for after 07/02/21- labs prior to visit.

## 2021-03-22 NOTE — Progress Notes (Signed)
Careteam: Patient Care Team: Lauree Chandler, NP as PCP - General (Geriatric Medicine)  PLACE OF SERVICE:  [place] Directive information Does Patient Have a Medical Advance Directive?: Yes, Type of Advance Directive: [place];Living will, Does patient want to make changes to medical advance directive?: No - Patient declined  No Known Allergies  Chief Complaint  Patient presents with   Medical Management of Chronic Issues    8 month follow-up and discuss need for singrix and PCV or exclude if patient refuses. Flu vaccine today      HPI: Patient is a 65 y.o. male for routine follow up.  Previously a patient of Dr Mariea Clonts. Pt with hx of hyperglycemia, hyperlipidemia, MVP, BPH without symptoms. Since last visit he is on medicare.   He turned 64 in June. Due for pneumonia vaccine - will get at [facility]  Had flu shot today, up to date with covid booster,  Due for shingles.   Pt is up to date on colonoscopy. -- has another scheduled for December. On the 5 year plan.   Continues to stay active.   Had a fall over the summer. Fell on right shoulder. Continues to have pain on ROM. Does yoga twice a week. Has a cortisone in the past would not want more at this time. Does not stop him from activity but painful.     Review of Systems:  Review of Systems  Constitutional:  Negative for chills, fever and weight loss.  HENT:  Negative for tinnitus.   Respiratory:  Negative for cough, sputum production and shortness of breath.   Cardiovascular:  Negative for chest pain, palpitations and leg swelling.  Gastrointestinal:  Negative for abdominal pain, constipation, diarrhea and heartburn.  Genitourinary:  Negative for dysuria, frequency and urgency.  Musculoskeletal:  Negative for back pain, falls, joint pain and myalgias.  Skin: Negative.   Neurological:  Negative for dizziness and headaches.  Psychiatric/Behavioral:  Negative for depression and memory loss. The  patient does not have insomnia.    Past Medical History:  Diagnosis Date   Allergy    Anxiety    BPH (benign prostatic hyperplasia)    Depression    Diverticulosis    Heart murmur    Hyperlipidemia    Mitral valve prolapse    Past Surgical History:  Procedure Laterality Date   WISDOM TOOTH EXTRACTION     Social History:   reports that he quit smoking about 6 years ago. His smoking use included cigarettes. He has never used smokeless tobacco. He reports current alcohol use of about 5.0 - 7.0 standard drinks per week. He reports that he does not use drugs.  Family History  Problem Relation Age of Onset   Stroke Father    Colon cancer Father    Diabetes Mother    Dementia Mother    Colon cancer Paternal Aunt    Esophageal cancer Neg Hx    Liver cancer Neg Hx    Pancreatic cancer Neg Hx    Rectal cancer Neg Hx    Stomach cancer Neg Hx     Medications: Patient's Medications  New Prescriptions   No medications on file  Previous Medications   CALCIUM CARB-CHOLECALCIFEROL (CALTRATE 600+D3) 600-800 MG-UNIT TABS    Take 1 tablet by mouth daily.   CETIRIZINE (ZYRTEC) 10 MG TABLET    Take 10 mg by mouth as needed for allergies.   IBUPROFEN (ADVIL) 200 MG TABLET    Take 200  mg by mouth as needed.  Modified Medications   No medications on file  Discontinued Medications   No medications on file    Physical Exam:  Vitals:   03/22/21 0909  BP: 128/80  Pulse: 86  Temp: (!) 97.1 F (36.2 C)  TempSrc: Temporal  SpO2: 95%  Weight: 171 lb (77.6 kg)  Height: 6' (1.829 m)   Body mass index is 23.19 kg/m. Wt Readings from Last 3 Encounters:  03/22/21 171 lb (77.6 kg)  07/02/20 174 lb 3.2 oz (79 kg)  07/01/19 186 lb 9.6 oz (84.6 kg)    Physical Exam Constitutional:      General: He is not in acute distress.    Appearance: He is well-developed. He is not diaphoretic.  HENT:     Head: Normocephalic and atraumatic.     Right Ear: External ear normal.     Left Ear:  External ear normal.     Mouth/Throat:     Pharynx: No oropharyngeal exudate.  Eyes:     Conjunctiva/sclera: Conjunctivae normal.     Pupils: Pupils are equal, round, and reactive to light.  Cardiovascular:     Rate and Rhythm: Normal rate and regular rhythm.     Heart sounds: Normal heart sounds.  Pulmonary:     Effort: Pulmonary effort is normal.     Breath sounds: Normal breath sounds.  Abdominal:     General: Bowel sounds are normal.     Palpations: Abdomen is soft.  Musculoskeletal:        General: No tenderness.     Cervical back: Normal range of motion and neck supple.     Right lower leg: No edema.     Left lower leg: No edema.  Skin:    General: Skin is warm and dry.  Neurological:     Mental Status: He is alert and oriented to person, place, and time.    Labs reviewed: Basic Metabolic Panel: Recent Labs    06/29/20 0830  NA 141  K 4.6  CL 103  CO2 32  GLUCOSE 77  BUN 14  CREATININE 1.00  CALCIUM 9.7   Liver Function Tests: Recent Labs    06/29/20 0830  AST 20  ALT 15  BILITOT 0.4  PROT 6.9   No results for input(s): LIPASE, AMYLASE in the last 8760 hours. No results for input(s): AMMONIA in the last 8760 hours. CBC: Recent Labs    06/29/20 0830  WBC 5.7  NEUTROABS 3,426  HGB 15.0  HCT 44.2  MCV 94.4  PLT 252   Lipid Panel: Recent Labs    06/29/20 0830  CHOL 198  HDL 86  LDLCALC 99  TRIG 43  CHOLHDL 2.3   TSH: No results for input(s): TSH in the last 8760 hours. A1C: Lab Results  Component Value Date   HGBA1C 5.5 06/29/2020     Assessment/Plan 1. Need for influenza vaccination - Flu Vaccine QUAD High Dose(Fluad)  2. Hyperlipidemia LDL goal <100 -continues dietary modifications.  - CMP with eGFR([facility]); Future - Lipid panel; Future  3. Hyperglycemia -continues lifestyle modifications.  - CMP with eGFR([facility]); Future - Hemoglobin A1c; Future  4. MVP (mitral valve prolapse) -asymptomatic, denies chest pain,  shortness of breath or LE edema.  - CBC with Differential/Platelet; Future  5. Benign prostatic hyperplasia without lower urinary tract symptoms Stable, continues without symptoms.   6. Pain of right shoulder joint on movement -aleve 1 tablet BID with food x 7 days. -ice area TID ~  20 mins  - Ambulatory referral to Physical Therapy   Next appt: 04/05/2021 for [facility] and then CPE in February  Penina Reisner K. Meg[place], [facility] [phone]

## 2021-04-05 ENCOUNTER — Encounter: Payer: Self-pay | Admitting: Nurse Practitioner

## 2021-04-05 ENCOUNTER — Ambulatory Visit (INDEPENDENT_AMBULATORY_CARE_PROVIDER_SITE_OTHER): Payer: Medicare (Managed Care) | Admitting: Nurse Practitioner

## 2021-04-05 ENCOUNTER — Other Ambulatory Visit: Payer: Self-pay

## 2021-04-05 VITALS — BP 112/78 | HR 68 | Temp 97.9°F | Ht 72.0 in | Wt 169.0 lb

## 2021-04-05 DIAGNOSIS — Z0000 Encounter for general adult medical examination without abnormal findings: Secondary | ICD-10-CM

## 2021-04-05 DIAGNOSIS — Z23 Encounter for immunization: Secondary | ICD-10-CM

## 2021-04-05 NOTE — Progress Notes (Signed)
Subjective:   Carlos Hickman is a 65 y.o. male who presents for a Welcome to Medicare exam.   Review of Systems:        Objective:    Today's Vitals   04/05/21 0859  BP: 112/78  Pulse: 68  Temp: 97.9 F (36.6 C)  TempSrc: Temporal  SpO2: 99%  Weight: 169 lb (76.7 kg)  Height: 6' (1.829 m)   Body mass index is 22.92 kg/m.  Medications Outpatient Encounter Medications as of 04/05/2021  Medication Sig   Calcium Carb-Cholecalciferol (CALTRATE 600+D3) 600-800 MG-UNIT TABS Take 1 tablet by mouth daily.   cetirizine (ZYRTEC) 10 MG tablet Take 10 mg by mouth as needed for allergies.   ibuprofen (ADVIL) 200 MG tablet Take 200 mg by mouth as needed.   No facility-administered encounter medications on file as of 04/05/2021.     History: Past Medical History:  Diagnosis Date   Allergy    Anxiety    BPH (benign prostatic hyperplasia)    Depression    Diverticulosis    Heart murmur    Hyperlipidemia    Mitral valve prolapse    Past Surgical History:  Procedure Laterality Date   WISDOM TOOTH EXTRACTION      Family History  Problem Relation Age of Onset   Stroke Father    Colon cancer Father    Diabetes Mother    Dementia Mother    Colon cancer Paternal Aunt    Esophageal cancer Neg Hx    Liver cancer Neg Hx    Pancreatic cancer Neg Hx    Rectal cancer Neg Hx    Stomach cancer Neg Hx    Social History   Occupational History   Not on file  Tobacco Use   Smoking status: Former    Types: Cigarettes    Quit date: 11/10/2014    Years since quitting: 6.4   Smokeless tobacco: Never  Vaping Use   Vaping Use: Never used  Substance and Sexual Activity   Alcohol use: Yes    Alcohol/week: 5.0 - 7.0 standard drinks    Types: 5 - 7 Standard drinks or equivalent per week    Comment: 3 or 4 times a week   Drug use: No   Sexual activity: Not on file   Tobacco Counseling Counseling given: Not Answered   Immunizations and Health Maintenance Immunization History   Administered Date(s) Administered   Fluad Quad(high Dose 65+) 03/22/2021   Influenza, Quadrivalent, Recombinant, Inj, Pf 03/18/2018   Influenza,inj,Quad PF,6+ Mos 03/24/2014, 02/26/2016, 03/18/2018, 04/09/2019   Influenza-Unspecified 03/20/2013, 03/24/2015, 03/25/2017, 04/12/2017, 03/13/2020   PFIZER Comirnaty(Gray Top)Covid-19 Tri-Sucrose Vaccine 07/15/2019, 08/05/2019, 04/12/2020   Pfizer Covid-19 Vaccine Bivalent Booster 65yrs & up 02/20/2021   Tdap 03/24/2015   Health Maintenance Due  Topic Date Due   Zoster Vaccines- Shingrix (1 of 2) Never done   Pneumonia Vaccine 24+ Years old (1 - PCV) Never done    Activities of Daily Living No flowsheet data found.  Physical Exam  (  Advanced Directives: Does Patient Have a Medical Advance Directive?: Yes Type of Advance Directive: Healthcare Power of Attorney, Living will Does patient want to make changes to medical advance directive?: No - Patient declined Copy of [place] in Chart?: Yes - validated most recent copy scanned in chart (See row information)    Assessment:    This is a routine wellness  examination for this patient .   Vision/Hearing screen Hearing Screening - Comments:: No hearing  issues  Vision Screening - Comments:: Last eye exam was January 2022. [facility], no specific physician, whoever is available   Dietary issues and exercise activities discussed:      Goals   None     Depression Screen PHQ 2/9 Scores 04/05/2021 07/02/2020 07/01/2019 06/25/2018  PHQ - 2 Score 0 0 0 0     Fall Risk Fall Risk  04/05/2021  Falls in the past year? 0  Number falls in past yr: 0  Injury with Fall? 0  Risk for fall due to : No Fall Risks  Follow up Falls evaluation completed    Cognitive Function MMSE - Mini Mental State Exam 04/05/2021  Orientation to time 5  Orientation to Place 5  Registration 3  Attention/ Calculation 5  Recall 3  Language- name 2 objects 2  Language- repeat 1   Language- follow 3 step command 3  Language- read & follow direction 1  Write a sentence 1  Copy design 1  Total score 30        Patient Care Team: Lauree Chandler, NP as PCP - General (Geriatric Medicine)     Plan:     I have personally reviewed and noted the following in the patient's chart:   Medical and social history Use of alcohol, tobacco or illicit drugs  Current medications and supplements Functional ability and status Nutritional status Physical activity Advanced directives List of other physicians Hospitalizations, surgeries, and ER visits in previous 12 months Vitals Screenings to include cognitive, depression, and falls Referrals and appointments  In addition, I have reviewed and discussed with patient certain preventive protocols, quality metrics, and best practice recommendations. A written personalized care plan for preventive services as well as general preventive health recommendations were provided to patient.    Lauree Chandler, NP 04/05/2021

## 2021-04-05 NOTE — Patient Instructions (Signed)
Carlos Hickman , Thank you for taking time to come for your Medicare Wellness Visit. I appreciate your ongoing commitment to your health goals. Please review the following plan we discussed and let me know if I can assist you in the future.   Screening recommendations/referrals: Colonoscopy scheduled for Dec 8th.  Recommended yearly ophthalmology/optometry visit for glaucoma screening and checkup Recommended yearly dental visit for hygiene and checkup  Vaccinations: Influenza vaccine up to date  Pneumococcal vaccine GIVEN TODAY Tdap vaccine up to date Shingles vaccine RECOMMENDED to get at your local pharmacy    Advanced directives: on file.  Conditions/risks identified: advanced age, male, family hx of premature stroke.   Next appointment: yearly for AWV  Preventive Care 65 Years and Older, Male Preventive care refers to lifestyle choices and visits with your health care provider that can promote health and wellness. What does preventive care include? A yearly physical exam. This is also called an annual well check. Dental exams once or twice a year. Routine eye exams. Ask your health care provider how often you should have your eyes checked. Personal lifestyle choices, including: Daily care of your teeth and gums. Regular physical activity. Eating a healthy diet. Avoiding tobacco and drug use. Limiting alcohol use. Practicing safe sex. Taking low doses of aspirin every day. Taking vitamin and mineral supplements as recommended by your health care provider. What happens during an annual well check? The services and screenings done by your health care provider during your annual well check will depend on your age, overall health, lifestyle risk factors, and family history of disease. Counseling  Your health care provider may ask you questions about your: Alcohol use. Tobacco use. Drug use. Emotional well-being. Home and relationship well-being. Sexual activity. Eating  habits. History of falls. Memory and ability to understand (cognition). Work and work Statistician. Screening  You may have the following tests or measurements: Height, weight, and BMI. Blood pressure. Lipid and cholesterol levels. These may be checked every 5 years, or more frequently if you are over 56 years old. Skin check. Lung cancer screening. You may have this screening every year starting at age 65 if you have a 30-pack-year history of smoking and currently smoke or have quit within the past 15 years. Fecal occult blood test (FOBT) of the stool. You may have this test every year starting at age 65. Flexible sigmoidoscopy or colonoscopy. You may have a sigmoidoscopy every 5 years or a colonoscopy every 10 years starting at age 24. Prostate cancer screening. Recommendations will vary depending on your family history and other risks. Hepatitis C blood test. Hepatitis B blood test. Sexually transmitted disease (STD) testing. Diabetes screening. This is done by checking your blood sugar (glucose) after you have not eaten for a while (fasting). You may have this done every 1-3 years. Abdominal aortic aneurysm (AAA) screening. You may need this if you are a current or former smoker. Osteoporosis. You may be screened starting at age 32 if you are at high risk. Talk with your health care provider about your test results, treatment options, and if necessary, the need for more tests. Vaccines  Your health care provider may recommend certain vaccines, such as: Influenza vaccine. This is recommended every year. Tetanus, diphtheria, and acellular pertussis (Tdap, Td) vaccine. You may need a Td booster every 10 years. Zoster vaccine. You may need this after age 33. Pneumococcal 13-valent conjugate (PCV13) vaccine. One dose is recommended after age 65. (PCV13) vaccine. One dose is recommended after age 65. Pneumococcal polysaccharide (PPSV23) vaccine. One dose is recommended  after age 66. Talk to your health care provider about which screenings and  vaccines you need and how often you need them. This information is not intended to replace advice given to you by your health care provider. Make sure you discuss any questions you have with your health care provider. Document Released: 06/05/2015 Document Revised: 01/27/2016 Document Reviewed: 03/10/2015 Elsevier Interactive Patient Education  2017 [place] Prevention in the Home Falls can cause injuries. They can happen to people of all ages. There are many things you can do to make your home safe and to help prevent falls. What can I do on the outside of my home? Regularly fix the edges of walkways and driveways and fix any cracks. Remove anything that might make you trip as you walk through a door, such as a raised step or threshold. Trim any bushes or trees on the path to your home. Use bright outdoor lighting. Clear any walking paths of anything that might make someone trip, such as rocks or tools. Regularly check to see if handrails are loose or broken. Make sure that both sides of any steps have handrails. Any raised decks and porches should have guardrails on the edges. Have any leaves, snow, or ice cleared regularly. Use sand or salt on walking paths during winter. Clean up any spills in your garage right away. This includes oil or grease spills. What can I do in the bathroom? Use night lights. Install grab bars by the toilet and in the tub and shower. Do not use towel bars as grab bars. Use non-skid mats or decals in the tub or shower. If you need to sit down in the shower, use a plastic, non-slip stool. Keep the floor dry. Clean up any water that spills on the floor as soon as it happens. Remove soap buildup in the tub or shower regularly. Attach bath mats securely with double-sided non-slip rug tape. Do not have throw rugs and other things on the floor that can make you trip. What can I do in the bedroom? Use night lights. Make sure that you have a light by your  bed that is easy to reach. Do not use any sheets or blankets that are too big for your bed. They should not hang down onto the floor. Have a firm chair that has side arms. You can use this for support while you get dressed. Do not have throw rugs and other things on the floor that can make you trip. What can I do in the kitchen? Clean up any spills right away. Avoid walking on wet floors. Keep items that you use a lot in easy-to-reach places. If you need to reach something above you, use a strong step stool that has a grab bar. Keep electrical cords out of the way. Do not use floor polish or wax that makes floors slippery. If you must use wax, use non-skid floor wax. Do not have throw rugs and other things on the floor that can make you trip. What can I do with my stairs? Do not leave any items on the stairs. Make sure that there are handrails on both sides of the stairs and use them. Fix handrails that are broken or loose. Make sure that handrails are as long as the stairways. Check any carpeting to make sure that it is firmly attached to the stairs. Fix any carpet that is loose or worn. Avoid having throw rugs at the top or bottom of the stairs. If you  do have throw rugs, attach them to the floor with carpet tape. Make sure that you have a light switch at the top of the stairs and the bottom of the stairs. If you do not have them, ask someone to add them for you. What else can I do to help prevent falls? Wear shoes that: Do not have high heels. Have rubber bottoms. Are comfortable and fit you well. Are closed at the toe. Do not wear sandals. If you use a stepladder: Make sure that it is fully opened. Do not climb a closed stepladder. Make sure that both sides of the stepladder are locked into place. Ask someone to hold it for you, if possible. Clearly mark and make sure that you can see: Any grab bars or handrails. First and last steps. Where the edge of each step is. Use tools that  help you move around (mobility aids) if they are needed. These include: Canes. Walkers. Scooters. Crutches. Turn on the lights when you go into a dark area. Replace any light bulbs as soon as they burn out. Set up your furniture so you have a clear path. Avoid moving your furniture around. If any of your floors are uneven, fix them. If there are any pets around you, be aware of where they are. Review your medicines with your doctor. Some medicines can make you feel dizzy. This can increase your chance of falling. Ask your doctor what other things that you can do to help prevent falls. This information is not intended to replace advice given to you by your health care provider. Make sure you discuss any questions you have with your health care provider. Document Released: 03/05/2009 Document Revised: 10/15/2015 Document Reviewed: 06/13/2014 Elsevier Interactive Patient Education  2017 [employer].

## 2021-04-12 ENCOUNTER — Encounter: Payer: Self-pay | Admitting: Internal Medicine

## 2021-04-12 ENCOUNTER — Other Ambulatory Visit: Payer: Self-pay

## 2021-04-12 ENCOUNTER — Ambulatory Visit (AMBULATORY_SURGERY_CENTER): Payer: 59

## 2021-04-12 VITALS — Ht 70.0 in | Wt 165.0 lb

## 2021-04-12 DIAGNOSIS — Z86010 Personal history of colonic polyps: Secondary | ICD-10-CM

## 2021-04-12 MED ORDER — SUTAB 1479-225-188 MG PO TABS: 12.0000 | ORAL_TABLET | ORAL | 0 refills | Status: DC

## 2021-04-12 NOTE — Progress Notes (Signed)
   Patient's pre-visit was done today over the phone with the patient   Name,DOB and address verified.   Patient denies any allergies to Eggs and Soy.  Patient denies any problems with anesthesia/sedation--has only had wisdom teeth extraction with sedation Patient denies taking diet pills or blood thinners.  Denies atrial flutter or atrial fib Denies chronic constipation No home Oxygen.   Packet of Prep instructions mailed to patient including a copy of a consent form-pt is aware.  Patient understands to call us back with any questions or concerns.  Patient is aware of our care-partner policy and [id number] safety protocol.   EMMI education assigned to the patient for the procedure, sent to [facility].   The patient is COVID-19 vaccinated.

## 2021-04-29 ENCOUNTER — Ambulatory Visit (AMBULATORY_SURGERY_CENTER): Payer: Medicare (Managed Care) | Admitting: Internal Medicine

## 2021-04-29 ENCOUNTER — Encounter: Payer: Self-pay | Admitting: Internal Medicine

## 2021-04-29 VITALS — BP 128/62 | HR 52 | Temp 96.6°F | Resp 16 | Ht 72.0 in | Wt 165.0 lb

## 2021-04-29 DIAGNOSIS — K635 Polyp of colon: Secondary | ICD-10-CM

## 2021-04-29 DIAGNOSIS — Z86010 Personal history of colonic polyps: Secondary | ICD-10-CM | POA: Diagnosis not present

## 2021-04-29 DIAGNOSIS — D123 Benign neoplasm of transverse colon: Secondary | ICD-10-CM

## 2021-04-29 HISTORY — PX: COLONOSCOPY: SHX174

## 2021-04-29 MED ORDER — SODIUM CHLORIDE 0.9 % IV SOLN: 500.0000 mL | Freq: Once | INTRAVENOUS | Status: DC

## 2021-04-29 NOTE — Progress Notes (Signed)
HISTORY OF PRESENT ILLNESS:  Carlos Hickman is a 65 y.o. male with history of multiple advanced adenomatous colon polyps presents today for surveillance colonoscopy.  No active complaints  REVIEW OF SYSTEMS:  All non-GI ROS negative. Past Medical History:  Diagnosis Date   Allergy    Anxiety    BPH (benign prostatic hyperplasia)    Depression    Diverticulosis    Heart murmur    Hyperlipidemia    Mitral valve prolapse     Past Surgical History:  Procedure Laterality Date   WISDOM TOOTH EXTRACTION      Social History Carlos Hickman  reports that he quit smoking about 6 years ago. His smoking use included cigarettes. He has never used smokeless tobacco. He reports current alcohol use of about 5.0 - 7.0 standard drinks per week. He reports that he does not use drugs.  family history includes Colon cancer in his paternal aunt; Dementia in his mother; Diabetes in his mother; Stroke in his father.  No Known Allergies     PHYSICAL EXAMINATION:  Vital signs: BP 136/73   Pulse 62   Temp (!) 96.6 F (35.9 C)   Ht 6' (1.829 m)   Wt 165 lb (74.8 kg)   SpO2 97%   BMI 22.38 kg/m  General: Well-developed, well-nourished, no acute distress HEENT: Sclerae are anicteric, conjunctiva pink. Oral mucosa intact Lungs: Clear Heart: Regular Abdomen: soft, nontender, nondistended, no obvious ascites, no peritoneal signs, normal bowel sounds. No organomegaly. Extremities: No edema Psychiatric: alert and oriented x3. Cooperative     ASSESSMENT:  1.  Multiple adenomatous and advanced (size) colon polyps.  Due for surveillance   PLAN:  1.  Surveillance colonoscopy

## 2021-04-29 NOTE — Patient Instructions (Signed)
Handouts on Diverticulosis and Polyps given.    YOU HAD AN ENDOSCOPIC PROCEDURE TODAY AT [facility]:   Refer to the procedure report that was given to you for any specific questions about what was found during the examination.  If the procedure report does not answer your questions, please call your gastroenterologist to clarify.  If you requested that your care partner not be given the details of your procedure findings, then the procedure report has been included in a sealed envelope for you to review at your convenience later.  YOU SHOULD EXPECT: Some feelings of bloating in the abdomen. Passage of more gas than usual.  Walking can help get rid of the air that was put into your GI tract during the procedure and reduce the bloating. If you had a lower endoscopy (such as a colonoscopy or flexible sigmoidoscopy) you may notice spotting of blood in your stool or on the toilet paper. If you underwent a bowel prep for your procedure, you may not have a normal bowel movement for a few days.  Please Note:  You might notice some irritation and congestion in your nose or some drainage.  This is from the oxygen used during your procedure.  There is no need for concern and it should clear up in a day or so.  SYMPTOMS TO REPORT IMMEDIATELY:  Following lower endoscopy (colonoscopy or flexible sigmoidoscopy):  Excessive amounts of blood in the stool  Significant tenderness or worsening of abdominal pains  Swelling of the abdomen that is new, acute  Fever of 100F or higher   For urgent or emergent issues, a gastroenterologist can be reached at any hour by calling [phone]. Do not use MyChart messaging for urgent concerns.    DIET:  We do recommend a small meal at first, but then you may proceed to your regular diet.  Drink plenty of fluids but you should avoid alcoholic beverages for 24 hours.  ACTIVITY:  You should plan to take it easy for the rest of today and you should NOT  DRIVE or use heavy machinery until tomorrow (because of the sedation medicines used during the test).    FOLLOW UP: Our staff will call the number listed on your records 48-72 hours following your procedure to check on you and address any questions or concerns that you may have regarding the information given to you following your procedure. If we do not reach you, we will leave a message.  We will attempt to reach you two times.  During this call, we will ask if you have developed any symptoms of COVID 19. If you develop any symptoms (ie: fever, flu-like symptoms, shortness of breath, cough etc.) before then, please call [phone].  If you test positive for Covid 19 in the 2 weeks post procedure, please call and report this information to [place].    If any biopsies were taken you will be contacted by phone or by letter within the next 1-3 weeks.  Please call us at [phone] if you have not heard about the biopsies in 3 weeks.    SIGNATURES/CONFIDENTIALITY: You and/or your care partner have signed paperwork which will be entered into your electronic medical record.  These signatures attest to the fact that that the information above on your After Visit Summary has been reviewed and is understood.  Full responsibility of the confidentiality of this discharge information lies with you and/or your care-partner.

## 2021-04-29 NOTE — Progress Notes (Signed)
Swelling noted on RFA where 1st attempt at IV was made.  Patient was instructed to elevate RFA and place icepack on RFA when he arrives at home.  No redness noted, area not hard.  Patient was also instructed if area became red or painful to call GI/endoscopy center and he verbalized understanding. SChaplin, RN,BSN

## 2021-04-29 NOTE — Op Note (Signed)
[facility] Patient Name: Carlos Hickman Procedure Date: 04/29/2021 10:46 AM MRN: [id number] Endoscopist: Docia Chuck. Henrene Pastor , MD Age: 65 Referring MD:  Date of Birth: 1955/06/07 Gender: Male Account #: [id number] Procedure:                Colonoscopy with cold snare polypectomy x 1 Indications:              High risk colon cancer surveillance: Personal                            history of adenoma (10 mm or greater in size).                            Previous examinations 2008, 2019 Medicines:                Monitored Anesthesia Care Procedure:                Pre-Anesthesia Assessment:                           - Prior to the procedure, a History and Physical                            was performed, and patient medications and                            allergies were reviewed. The patient's tolerance of                            previous anesthesia was also reviewed. The risks                            and benefits of the procedure and the sedation                            options and risks were discussed with the patient.                            All questions were answered, and informed consent                            was obtained. Prior Anticoagulants: The patient has                            taken no previous anticoagulant or antiplatelet                            agents. ASA Grade Assessment: II - A patient with                            mild systemic disease. After reviewing the risks                            and benefits, the patient was deemed in  satisfactory condition to undergo the procedure.                           After obtaining informed consent, the colonoscope                            was passed under direct vision. Throughout the                            procedure, the patient's blood pressure, pulse, and                            oxygen saturations were monitored continuously. The                             Olympus CF-HQ190L [id number]) Colonoscope was                            introduced through the anus and advanced to the the                            cecum, identified by appendiceal orifice and                            ileocecal valve. The ileocecal valve, appendiceal                            orifice, and rectum were photographed. The quality                            of the bowel preparation was adequate. The                            colonoscopy was performed without difficulty. The                            patient tolerated the procedure well. The bowel                            preparation used was SUPREP/tablets via split dose                            instruction. Scope In: 11:08:17 AM Scope Out: 11:26:03 AM Scope Withdrawal Time: 0 hours 15 minutes 19 seconds  Total Procedure Duration: 0 hours 17 minutes 46 seconds  Findings:                 A 4 mm polyp was found in the transverse colon. The                            polyp was removed with a cold snare. Resection and                            retrieval were complete.  Multiple diverticula were found in the sigmoid                            colon.                           The exam was otherwise without abnormality on                            direct and retroflexion views. Complications:            No immediate complications. Estimated blood loss:                            None. Estimated Blood Loss:     Estimated blood loss: none. Impression:               - One 4 mm polyp in the transverse colon, removed                            with a cold snare. Resected and retrieved.                           - Diverticulosis in the sigmoid colon.                           - The examination was otherwise normal on direct                            and retroflexion views. Recommendation:           - Repeat colonoscopy in 5 years for surveillance.                           - Patient has a contact  number available for                            emergencies. The signs and symptoms of potential                            delayed complications were discussed with the                            patient. Return to normal activities tomorrow.                            Written discharge instructions were provided to the                            patient.                           - Resume previous diet.                           - Continue present medications.                           -  Await pathology results. Docia Chuck. Henrene Pastor, MD 04/29/2021 11:45:25 AM This report has been signed electronically.

## 2021-04-29 NOTE — Progress Notes (Signed)
A and O x3. Report to RN. Tolerated MAC anesthesia well. 

## 2021-04-29 NOTE — Progress Notes (Signed)
Pt's states no medical or surgical changes since previsit or office visit.   VS taken by DT 

## 2021-04-29 NOTE — Progress Notes (Signed)
Called to room to assist during endoscopic procedure.  Patient ID and intended procedure confirmed with present staff. Received instructions for my participation in the procedure from the performing physician.  

## 2021-05-03 ENCOUNTER — Telehealth: Payer: Self-pay

## 2021-05-03 NOTE — Telephone Encounter (Signed)
  Follow up Call-  Call back number 04/29/2021  Post procedure Call Back phone  # [phone]  Permission to leave phone message Yes  Some recent data might be hidden     Patient questions:  Do you have a fever, pain , or abdominal swelling? No. Pain Score  0 *  Have you tolerated food without any problems? Yes.    Have you been able to return to your normal activities? Yes.    Do you have any questions about your discharge instructions: Diet   No. Medications  No. Follow up visit  No.  Do you have questions or concerns about your Care? No.  Actions: * If pain score is 4 or above: No action needed, pain <4.

## 2021-05-04 ENCOUNTER — Encounter: Payer: Self-pay | Admitting: Internal Medicine

## 2021-06-25 ENCOUNTER — Other Ambulatory Visit: Payer: Self-pay

## 2021-06-25 ENCOUNTER — Other Ambulatory Visit: Payer: Medicare (Managed Care)

## 2021-06-25 DIAGNOSIS — I341 Nonrheumatic mitral (valve) prolapse: Secondary | ICD-10-CM

## 2021-06-25 DIAGNOSIS — R739 Hyperglycemia, unspecified: Secondary | ICD-10-CM

## 2021-06-25 DIAGNOSIS — E785 Hyperlipidemia, unspecified: Secondary | ICD-10-CM

## 2021-06-26 LAB — COMPLETE METABOLIC PANEL WITH GFR
AG Ratio: 2.3 (calc) (ref 1.0–2.5)
ALT: 16 U/L (ref 9–46)
AST: 20 U/L (ref 10–35)
Albumin: 4.8 g/dL (ref 3.6–5.1)
Alkaline phosphatase (APISO): 65 U/L (ref 35–144)
BUN: 14 mg/dL (ref 7–25)
CO2: 29 mmol/L (ref 20–32)
Calcium: 10.2 mg/dL (ref 8.6–10.3)
Chloride: 101 mmol/L (ref 98–110)
Creat: 1.13 mg/dL (ref 0.70–1.35)
Globulin: 2.1 g/dL (calc) (ref 1.9–3.7)
Glucose, Bld: 87 mg/dL (ref 65–99)
Potassium: 4.8 mmol/L (ref 3.5–5.3)
Sodium: 139 mmol/L (ref 135–146)
Total Bilirubin: 0.7 mg/dL (ref 0.2–1.2)
Total Protein: 6.9 g/dL (ref 6.1–8.1)
eGFR: 72 mL/min/{1.73_m2} (ref >=60)

## 2021-06-26 LAB — CBC WITH DIFFERENTIAL/PLATELET
Absolute Monocytes: 500 cells/uL (ref 200–950)
Basophils Absolute: 40 cells/uL (ref 0–200)
Basophils Relative: 0.8 %
Eosinophils Absolute: 70 cells/uL (ref 15–500)
Eosinophils Relative: 1.4 %
HCT: 45.9 % (ref 38.5–50.0)
Hemoglobin: 15.6 g/dL (ref 13.2–17.1)
Lymphs Abs: 1955 cells/uL (ref 850–3900)
MCH: 31.7 pg (ref 27.0–33.0)
MCHC: 34 g/dL (ref 32.0–36.0)
MCV: 93.3 fL (ref 80.0–100.0)
MPV: 11.4 fL (ref 7.5–12.5)
Monocytes Relative: 10 %
Neutro Abs: 2435 cells/uL (ref 1500–7800)
Neutrophils Relative %: 48.7 %
Platelets: 230 10*3/uL (ref 140–400)
RBC: 4.92 10*6/uL (ref 4.20–5.80)
RDW: 12.2 % (ref 11.0–15.0)
Total Lymphocyte: 39.1 %
WBC: 5 10*3/uL (ref 3.8–10.8)

## 2021-06-26 LAB — LIPID PANEL
Cholesterol: 192 mg/dL (ref <200)
HDL: 96 mg/dL (ref >=40)
LDL Cholesterol (Calc): 86 mg/dL (calc)
Non-HDL Cholesterol (Calc): 96 mg/dL (calc) (ref <130)
Total CHOL/HDL Ratio: 2 (calc) (ref <5.0)
Triglycerides: 35 mg/dL (ref <150)

## 2021-06-26 LAB — HEMOGLOBIN A1C
Hgb A1c MFr Bld: 5.5 % of total Hgb (ref <5.7)
Mean Plasma Glucose: 111 mg/dL
eAG (mmol/L): 6.2 mmol/L

## 2021-07-05 ENCOUNTER — Ambulatory Visit: Payer: Medicare (Managed Care) | Admitting: Nurse Practitioner

## 2021-07-12 ENCOUNTER — Other Ambulatory Visit: Payer: Self-pay

## 2021-07-12 ENCOUNTER — Ambulatory Visit (INDEPENDENT_AMBULATORY_CARE_PROVIDER_SITE_OTHER): Payer: Medicare Other | Admitting: Nurse Practitioner

## 2021-07-12 ENCOUNTER — Encounter: Payer: Self-pay | Admitting: Nurse Practitioner

## 2021-07-12 VITALS — BP 128/86 | HR 51 | Temp 97.1°F | Ht 72.0 in | Wt 170.0 lb

## 2021-07-12 DIAGNOSIS — E785 Hyperlipidemia, unspecified: Secondary | ICD-10-CM

## 2021-07-12 DIAGNOSIS — I341 Nonrheumatic mitral (valve) prolapse: Secondary | ICD-10-CM

## 2021-07-12 DIAGNOSIS — R739 Hyperglycemia, unspecified: Secondary | ICD-10-CM

## 2021-07-12 DIAGNOSIS — Z0000 Encounter for general adult medical examination without abnormal findings: Secondary | ICD-10-CM | POA: Diagnosis not present

## 2021-07-12 NOTE — Patient Instructions (Signed)
Shingles vaccine at local pharmacy

## 2021-07-12 NOTE — Progress Notes (Signed)
Careteam: Patient Care Team: Carlos Chandler, NP as PCP - General (Geriatric Medicine)  PLACE OF SERVICE:  [place] Directive information Does Patient Have a Medical Advance Directive?: Yes, Type of Advance Directive: Living will;Healthcare Power of Attorney, Does patient want to make changes to medical advance directive?: No - Patient declined  No Known Allergies  Chief Complaint  Patient presents with   Annual Exam    Yearly physical and discuss labs (viewed via mychart). Patient denies receiving any vaccines since last visit. Discuss need for shingrix or postpone if patient refuses.      HPI: Patient is a 66 y.o. male here for annual exam. He is doing well without concerns.   Exercise: rides a bicycle daily.  Diet: regular diet,described as healthy.  Currently on no prescribed medications, only OTC medicines (calcium, zyrtec, and ibuprofen).   Does not have a cardiologist. Has a history of RBBB noted on file and MVP-denies having echo in the past.   He is up to date on colonoscopy.   Up to date with eye exam, plans to get shingles vaccine.   Denies smoking, consumes alcohol occasionally.    Review of Systems:  Review of Systems  Constitutional:  Negative for chills, fever, malaise/fatigue and weight loss.  Respiratory:  Negative for cough, sputum production, shortness of breath and wheezing.   Cardiovascular:  Negative for chest pain, palpitations, orthopnea and leg swelling.  Gastrointestinal:  Negative for abdominal pain, constipation, diarrhea, heartburn, nausea and vomiting.  Genitourinary:  Negative for dysuria, frequency and urgency.  Musculoskeletal:  Negative for joint pain and myalgias.  Neurological:  Negative for dizziness and headaches.  Psychiatric/Behavioral:  Negative for depression. The patient does not have insomnia.    Past Medical History:  Diagnosis Date   Allergy    Anxiety    BPH (benign prostatic hyperplasia)     Depression    Diverticulosis    Heart murmur    Hyperlipidemia    Mitral valve prolapse    Past Surgical History:  Procedure Laterality Date   COLONOSCOPY  04/29/2021   and 2019 with Scarlette Shorts at [place] History:   reports that he quit smoking about 6 years ago. His smoking use included cigarettes. He has never used smokeless tobacco. He reports current alcohol use of about 5.0 - 7.0 standard drinks per week. He reports that he does not use drugs.  Family History  Problem Relation Age of Onset   Diabetes Mother    Dementia Mother    Stroke Father    Colon cancer Paternal Aunt    Esophageal cancer Neg Hx    Liver cancer Neg Hx    Pancreatic cancer Neg Hx    Rectal cancer Neg Hx    Stomach cancer Neg Hx    Colon polyps Neg Hx     Medications: Patient's Medications  New Prescriptions   No medications on file  Previous Medications   CALCIUM CARB-CHOLECALCIFEROL (CALTRATE 600+D3) 600-800 MG-UNIT TABS    Take 1 tablet by mouth daily.   CETIRIZINE (ZYRTEC) 10 MG TABLET    Take 10 mg by mouth as needed for allergies.   IBUPROFEN (ADVIL) 200 MG TABLET    Take 200 mg by mouth as needed.  Modified Medications   No medications on file  Discontinued Medications   No medications on file    Physical Exam:  Vitals:   07/12/21 0801  BP:  128/86  Pulse: (!) 51  Temp: (!) 97.1 F (36.2 C)  TempSrc: Temporal  SpO2: 98%  Weight: 170 lb (77.1 kg)  Height: 6' (1.829 m)   Body mass index is 23.06 kg/m. Wt Readings from Last 3 Encounters:  07/12/21 170 lb (77.1 kg)  04/29/21 165 lb (74.8 kg)  04/12/21 165 lb (74.8 kg)    Physical Exam Constitutional:      Appearance: Normal appearance. He is normal weight.  HENT:     Right Ear: Tympanic membrane, ear canal and external ear normal.     Left Ear: Tympanic membrane, ear canal and external ear normal.  Eyes:     Conjunctiva/sclera: Conjunctivae normal.  Cardiovascular:     Rate and Rhythm:  Regular rhythm. Bradycardia present.     Pulses: Normal pulses.     Heart sounds: Normal heart sounds.  Pulmonary:     Effort: Pulmonary effort is normal.     Breath sounds: Normal breath sounds.  Abdominal:     General: Bowel sounds are normal.  Musculoskeletal:        General: Normal range of motion.     Cervical back: Normal range of motion.  Skin:    General: Skin is warm and dry.  Neurological:     Mental Status: He is alert and oriented to person, place, and time.  Psychiatric:        Mood and Affect: Mood normal.        Behavior: Behavior normal.        Thought Content: Thought content normal.        Judgment: Judgment normal.    Labs reviewed: Basic Metabolic Panel: Recent Labs    06/25/21 0820  NA 139  K 4.8  CL 101  CO2 29  GLUCOSE 87  BUN 14  CREATININE 1.13  CALCIUM 10.2   Liver Function Tests: Recent Labs    06/25/21 0820  AST 20  ALT 16  BILITOT 0.7  PROT 6.9   No results for input(s): LIPASE, AMYLASE in the last 8760 hours. No results for input(s): AMMONIA in the last 8760 hours. CBC: Recent Labs    06/25/21 0820  WBC 5.0  NEUTROABS 2,435  HGB 15.6  HCT 45.9  MCV 93.3  PLT 230   Lipid Panel: Recent Labs    06/25/21 0820  CHOL 192  HDL 96  LDLCALC 86  TRIG 35  CHOLHDL 2.0   TSH: No results for input(s): TSH in the last 8760 hours. A1C: Lab Results  Component Value Date   HGBA1C 5.5 06/25/2021     Assessment/Plan  1. Annual physical exam - Continue daily physical activity and healthy dietary in take. Low carbohydrates, low sugars, high protein intake.  -Currently not taking any prescribed medications -Patient will get Shingles vaccine -up to date with health maintenance.    2. MVP (mitral valve prolapse) - Patient was previously diagnosed with mitral valve prolapse and no echo reading found on file.  -Patient has no explains of dyspnea, orthopnea, chest pain, or  chest tightness.  -No murmur heard today on  exam -Stable condition, no issues.  Order:  - ECHO COMPLETE (BACK OFFICE); Future  3. Hyperlipidemia LDL goal <100 -cholesterol at goal. Continue current regimen.  4. Hyperglycemia -at goal, continue dietary modification with exercise.    Next appt: Return in about 1 year (around 07/12/2022) for yearly with labs prior to appt . I personally was present during the history, physical exam and medical decision-making activities  of this service and have verified that the service and findings are accurately documented in the students note Janett Billow K. [place], [facility] [phone]

## 2021-07-22 ENCOUNTER — Encounter: Payer: Self-pay | Admitting: Nurse Practitioner

## 2021-07-29 ENCOUNTER — Encounter: Payer: Self-pay | Admitting: Nurse Practitioner

## 2021-08-09 ENCOUNTER — Other Ambulatory Visit: Payer: Self-pay

## 2021-08-09 ENCOUNTER — Ambulatory Visit (HOSPITAL_COMMUNITY): Payer: Medicare Other | Attending: Cardiology

## 2021-08-09 DIAGNOSIS — I341 Nonrheumatic mitral (valve) prolapse: Secondary | ICD-10-CM | POA: Insufficient documentation

## 2021-08-09 LAB — ECHOCARDIOGRAM COMPLETE
Area-P 1/2: 3.08 cm2
S' Lateral: 3 cm

## 2021-08-17 ENCOUNTER — Telehealth: Payer: Self-pay | Admitting: Nurse Practitioner

## 2021-08-17 NOTE — Telephone Encounter (Signed)
Pt has some ?'s regarding: ? ?Echo that was denied by insurance ?Notes documented on mychart form visit from CPE 07/12/21 ?Carlos Hickman feels like there some mis info or in correct info document in chart ? ?Please call  ?[name] ?

## 2021-08-18 NOTE — Telephone Encounter (Signed)
So they scheduled the echo without approval from the insurance. Generally they will not do this until it has been improved. My note had the diagnosis and the reason behind obtaining the echo- due to diagnosis.  ?

## 2021-08-18 NOTE — Telephone Encounter (Signed)
Called patient to get more information on message, [facility] for patient to return call.  ?

## 2021-08-18 NOTE — Telephone Encounter (Signed)
Patient called and stated that he had a procedure done for EchoCardiogram and he received a letter from [facility] for Denial of Coverage.  ?Needs to know what the next step is to get it covered.  ? ?Please Advise.  ?

## 2021-08-18 NOTE — Telephone Encounter (Signed)
Checked with [place] and Lattie Haw.  ? ?From Order Dated 08/05/2021: ?08/06/21 [facility] to schedule @ 10:54/LBW  ?08/05/21 Inbasket sent for PA# ?08/05/21 no auth req per [facility] for [place], [id number], faxed clinicals for [place] to  ?[phone] for case# [id number] ?pt was denied for [phone] with clinical info faxed over Case #[id number] ? ?We have Documented that No Josem Kaufmann was required for Codes 93306 nor [phone] but Clinical notes had to be faxed to insurance company for Code [phone] and were faxed on 08/05/21.  ? ?Procedure was performed before they received authorization for the code [phone] ? ?Patient notified and stated that he is going to call [facility].  ?

## 2022-03-11 NOTE — Progress Notes (Unsigned)
[facility] [place] Phone: [phone] Subjective:   [name], am serving as a scribe for Dr. Hulan Saas.  I'm seeing this patient by the request  of:  Lauree Chandler, NP  CC: Right shoulder pain  [id number]  ZEREK LITSEY is a 66 y.o. male coming in with complaint of R shoulder pain. Patient states that he has had pain for years. Pain typically in anterior shoulder. Painful to reach and flex shoulder. Denies any radiating symptoms. Had to stop doing yoga due to pain. Uses NSAIDs prn.      Past Medical History:  Diagnosis Date   Allergy    Anxiety    BPH (benign prostatic hyperplasia)    Depression    Diverticulosis    Heart murmur    Hyperlipidemia    Mitral valve prolapse    Past Surgical History:  Procedure Laterality Date   COLONOSCOPY  04/29/2021   and 2019 with Scarlette Shorts at [place] History   Socioeconomic History   Marital status: Married    Spouse name: Not on file   Number of children: Not on file   Years of education: Not on file   Highest education level: Not on file  Occupational History   Not on file  Tobacco Use   Smoking status: Former    Types: Cigarettes    Quit date: 11/10/2014    Years since quitting: 7.3   Smokeless tobacco: Never  Vaping Use   Vaping Use: Never used  Substance and Sexual Activity   Alcohol use: Yes    Alcohol/week: 5.0 - 7.0 standard drinks of alcohol    Types: 5 - 7 Standard drinks or equivalent per week    Comment: 3 or 4 times a week   Drug use: No   Sexual activity: Not on file  Other Topics Concern   Not on file  Social History Narrative   Diet: Regular      Do you drink/ eat things with caffeine? Yes      Marital status:   Maried                            What year were you married ? 1983      Do you live in a house, apartment,assistred living, condo, trailer, etc.)? House      Is it one or  more stories?  No      How many persons live in your home ?  1      Do you have any pets in your home ?(please list) No      Current or past profession: Freight forwarder at [employer] you exercise? Occasionally                               Type & how often: Walk,Run Bike      Do you have a living will? No      Do you have a DNR form?   No                    If not, do you want to discuss one? Will do so      Do you have signed POA?HPOA forms? No  If so, please bring to your        appointment         Social Determinants of Health   Financial Resource Strain: Not on file  Food Insecurity: Not on file  Transportation Needs: Not on file  Physical Activity: Not on file  Stress: Not on file  Social Connections: Not on file   No Known Allergies Family History  Problem Relation Age of Onset   Diabetes Mother    Dementia Mother    Stroke Father    Colon cancer Paternal Aunt    Esophageal cancer Neg Hx    Liver cancer Neg Hx    Pancreatic cancer Neg Hx    Rectal cancer Neg Hx    Stomach cancer Neg Hx    Colon polyps Neg Hx       Current Outpatient Medications (Respiratory):    cetirizine (ZYRTEC) 10 MG tablet, Take 10 mg by mouth as needed for allergies.  Current Outpatient Medications (Analgesics):    ibuprofen (ADVIL) 200 MG tablet, Take 200 mg by mouth as needed.   Current Outpatient Medications (Other):    Calcium Carb-Cholecalciferol (CALTRATE 600+D3) 600-800 MG-UNIT TABS, Take 1 tablet by mouth daily.   Reviewed prior external information including notes and imaging from  primary care provider As well as notes that were available from care everywhere and other healthcare systems.  Past medical history, social, surgical and family history all reviewed in electronic medical record.  No pertanent information unless stated regarding to the chief complaint.   Review of Systems:  No headache, visual changes, nausea, vomiting, diarrhea, constipation,  dizziness, abdominal pain, skin rash, fevers, chills, night sweats, weight loss, swollen lymph nodes, body aches, joint swelling, chest pain, shortness of breath, mood changes. POSITIVE muscle aches  Objective  Blood pressure 130/72, pulse (!) 58, height 6' (1.829 m), weight 166 lb (75.3 kg), SpO2 98 %.   General: No apparent distress alert and oriented x3 mood and affect normal, dressed appropriately.  HEENT: Pupils equal, extraocular movements intact  Respiratory: Patient's speak in full sentences and does not appear short of breath  Cardiovascular: No lower extremity edema, non tender, no erythema  Right shoulder exam shows that patient does have weakness noted with 3 out of 5 strength compared to the contralateral side.  Patient does have positive O'Brien sign noted as well.  Positive crossover sign noted.  Voluntary guarding noted.  Limited muscular skeletal ultrasound was performed and interpreted by Hulan Saas, M  Limited ultrasound of patient's rotator cuff does show what appears to be a full-thickness tear of the supraspinatus.  Minimal retraction though noted.  Patient does have some degenerative changes also noted of the subscapularis.  Bicep tendon hypoechoic changes but no true tear appreciated.  Moderate narrowing of the acromioclavicular joint noted. Impression: Rotator cuff tear with moderate AC arthritis   Procedure: Real-time Ultrasound Guided Injection of right glenohumeral joint Device: GE Logiq Q7  Ultrasound guided injection is preferred based studies that show increased duration, increased effect, greater accuracy, decreased procedural pain, increased response rate with ultrasound guided versus blind injection.  Verbal informed consent obtained.  Time-out conducted.  Noted no overlying erythema, induration, or other signs of local infection.  Skin prepped in a sterile fashion.  Local anesthesia: Topical Ethyl chloride.  With sterile technique and under real time  ultrasound guidance:  Joint visualized.  23g 1  inch needle inserted posterior approach. Pictures taken for needle placement. Patient did have injection of 2  cc of 1% lidocaine, 2 cc of 0.5% Marcaine, and 1.0 cc of Kenalog 40 mg/dL. Completed without difficulty  Pain immediately resolved suggesting accurate placement of the medication.  Advised to call if fevers/chills, erythema, induration, drainage, or persistent bleeding.  Impression: Technically successful ultrasound guided injection.  Procedure: Real-time Ultrasound Guided Injection of right Acromioclavicular joint Device: GE Logiq Q7 Ultrasound guided injection is preferred based studies that show increased duration, increased effect, greater accuracy, decreased procedural pain, increased response rate, and decreased cost with ultrasound guided versus blind injection.  Verbal informed consent obtained.  Time-out conducted.  Noted no overlying erythema, induration, or other signs of local infection.  Skin prepped in a sterile fashion.  Local anesthesia: Topical Ethyl chloride.  With sterile technique and under real time ultrasound guidance: With a 25-gauge half inch needle injected with 0.5 cc of 0.5% Marcaine and 0.5 cc of Kenalog 40 mg/mL Completed without difficulty  Pain immediately resolved suggesting accurate placement of the medication.  Advised to call if fevers/chills, erythema, induration, drainage, or persistent bleeding.  Impression: Technically successful ultrasound guided injection.    Impression and Recommendations:    The above documentation has been reviewed and is accurate and complete Lyndal Pulley, DO

## 2022-03-14 ENCOUNTER — Ambulatory Visit: Payer: Medicare Other | Admitting: Family Medicine

## 2022-03-14 ENCOUNTER — Ambulatory Visit: Payer: Self-pay

## 2022-03-14 ENCOUNTER — Encounter: Payer: Self-pay | Admitting: Family Medicine

## 2022-03-14 ENCOUNTER — Ambulatory Visit (INDEPENDENT_AMBULATORY_CARE_PROVIDER_SITE_OTHER): Payer: Medicare Other

## 2022-03-14 VITALS — BP 130/72 | HR 58 | Ht 72.0 in | Wt 166.0 lb

## 2022-03-14 DIAGNOSIS — M25511 Pain in right shoulder: Secondary | ICD-10-CM

## 2022-03-14 DIAGNOSIS — M75101 Unspecified rotator cuff tear or rupture of right shoulder, not specified as traumatic: Secondary | ICD-10-CM | POA: Insufficient documentation

## 2022-03-14 DIAGNOSIS — M19019 Primary osteoarthritis, unspecified shoulder: Secondary | ICD-10-CM | POA: Insufficient documentation

## 2022-03-14 DIAGNOSIS — M75121 Complete rotator cuff tear or rupture of right shoulder, not specified as traumatic: Secondary | ICD-10-CM | POA: Diagnosis not present

## 2022-03-14 DIAGNOSIS — M19011 Primary osteoarthritis, right shoulder: Secondary | ICD-10-CM | POA: Diagnosis not present

## 2022-03-14 NOTE — Assessment & Plan Note (Signed)
Injection given today and tolerated the procedure well, discussed icing regimen and home exercises, discussed which activities to do and which ones to avoid.  Follow-up with me again in 6 to 8 weeks

## 2022-03-14 NOTE — Patient Instructions (Addendum)
Xray today Exercises [facility] PT will call you- [phone] Injected shoulder and AC joint today Keep hands in peripheral vision See me again in 6-8 weeks

## 2022-03-14 NOTE — Assessment & Plan Note (Signed)
Injection given today.  Did have arthritic changes noted.  Improvement in range of motion and strength immediately after injection which makes me feel more optimistic that patient will do well with conservative therapy.  We will get patient in with formal physical therapy which I think will be helpful.  X-rays are pending.  Discussed ibuprofen 200 mg as needed.  Follow-up with me again in 6 to 8 weeks

## 2022-04-07 ENCOUNTER — Telehealth: Payer: Self-pay

## 2022-04-07 ENCOUNTER — Encounter: Payer: Self-pay | Admitting: Nurse Practitioner

## 2022-04-07 ENCOUNTER — Ambulatory Visit (INDEPENDENT_AMBULATORY_CARE_PROVIDER_SITE_OTHER): Payer: Medicare Other | Admitting: Nurse Practitioner

## 2022-04-07 DIAGNOSIS — Z0000 Encounter for general adult medical examination without abnormal findings: Secondary | ICD-10-CM | POA: Diagnosis not present

## 2022-04-07 NOTE — Progress Notes (Signed)
Subjective:   Carlos Hickman is a 66 y.o. male who presents for Medicare Annual/Subsequent preventive examination.  Review of Systems     Cardiac Risk Factors include: advanced age (>64mn, >>22women);hypertension;family history of premature cardiovascular disease;dyslipidemia;male gender     Objective:    Today's Vitals   04/07/22 1124  PainSc: 3    There is no height or weight on file to calculate BMI.     04/07/2022   11:08 AM 07/12/2021    8:05 AM 04/05/2021    8:18 AM 03/22/2021    9:07 AM 07/02/2020    7:42 AM 07/01/2019    9:00 AM 10/01/2015    8:50 AM  Advanced Directives  Does Patient Have a Medical Advance Directive? Yes Yes Yes Yes Yes Yes No  Type of AParamedicof AHometownLiving will Living will;Healthcare Power of AMab[place]Living will HH[place]Living will Living will    Does patient want to make changes to medical advance directive? No - Patient declined No - Patient declined No - Patient declined No - Patient declined No - Patient declined No - Patient declined   Copy of HPhil Campbellin Chart? Yes - validated most recent copy scanned in chart (See row information) Yes - validated most recent copy scanned in chart (See row information) Yes - validated most recent copy scanned in chart (See row information) Yes - validated most recent copy scanned in chart (See row information)     Would patient like information on creating a medical advance directive?       No - patient declined information    Current Medications (verified) Outpatient Encounter Medications as of 04/07/2022  Medication Sig   Calcium Carb-Cholecalciferol (CALTRATE 600+D3) 600-800 MG-UNIT TABS Take 1 tablet by mouth daily.   cetirizine (ZYRTEC) 10 MG tablet Take 10 mg by mouth as needed for allergies.   ibuprofen (ADVIL) 200 MG tablet Take 200 mg by mouth as needed.   No facility-administered encounter  medications on file as of 04/07/2022.    Allergies (verified) Patient has no known allergies.   History: Past Medical History:  Diagnosis Date   Allergy    Anxiety    BPH (benign prostatic hyperplasia)    Depression    Diverticulosis    Heart murmur    Hyperlipidemia    Mitral valve prolapse    Past Surgical History:  Procedure Laterality Date   COLONOSCOPY  04/29/2021   and [place]  Problem Relation Age of Onset   Diabetes Mother    Dementia Mother    Stroke Father    Colon cancer Paternal Aunt    Esophageal cancer Neg Hx    Liver cancer Neg Hx    Pancreatic cancer Neg Hx    Rectal cancer Neg Hx    Stomach cancer Neg Hx    Colon polyps Neg Hx    Social History   Socioeconomic History   Marital status: Married    Spouse name: Not on file   Number of children: Not on file   Years of education: Not on file   Highest education level: Not on file  Occupational History   Not on file  Tobacco Use   Smoking status: Former    Types: Cigarettes    Quit date: 11/10/2014    Years since quitting: 7.4   Smokeless tobacco: Never  Vaping Use   Vaping Use: Never used  Substance and Sexual Activity   Alcohol use: Yes    Alcohol/week: 5.0 - 7.0 standard drinks of alcohol    Types: 5 - 7 Standard drinks or equivalent per week    Comment: 3 or 4 times a week   Drug use: No   Sexual activity: Not on file  Other Topics Concern   Not on file  Social History Narrative   Diet: Regular      Do you drink/ eat things with caffeine? Yes      Marital status:   Maried                            What year were you married ? 1983      Do you live in a house, apartment,assistred living, condo, trailer, etc.)? House      Is it one or more stories?  No      How many persons live in your home ?  1      Do you have any pets in your home ?(please list) No      Current or past profession: Freight forwarder at [employer]  you exercise? Occasionally                               Type & how often: Walk,Run Bike      Do you have a living will? No      Do you have a DNR form?   No                    If not, do you want to discuss one? Will do so      Do you have signed POA?HPOA forms? No                If so, please bring to your        appointment         Social Determinants of Health   Financial Resource Strain: Not on file  Food Insecurity: Not on file  Transportation Needs: Not on file  Physical Activity: Not on file  Stress: Not on file  Social Connections: Not on file    Tobacco Counseling Counseling given: Not Answered   Clinical Intake:  Pre-visit preparation completed: Yes  Pain : 0-10 Pain Score: 3  Pain Type: Acute pain Pain Location: Shoulder Pain Orientation: Right Pain Descriptors / Indicators: Aching     BMI - recorded: 22 Nutritional Status: BMI of 19-24  Normal Diabetes: No  How often do you need to have someone help you when you read instructions, pamphlets, or other written materials from your doctor or pharmacy?: 1 - Never  [place]         Activities of Daily Living    04/07/2022   11:22 AM  In your present state of health, do you have any difficulty performing the following activities:  Hearing? 0  Vision? 0  Difficulty concentrating or making decisions? 0  Walking or climbing stairs? 0  Dressing or bathing? 0  Doing errands, shopping? 0  Preparing Food and eating ? N  Using the Toilet? N  In the past six months, have you accidently leaked urine? N  Do you have problems with loss of bowel control? N  Managing your Medications? N  Managing your Finances?  N  Housekeeping or managing your Housekeeping? N    Patient Care Team: Lauree Chandler, NP as PCP - General (Geriatric Medicine)  Indicate any recent Medical Services you may have received from other than [facility] providers in the past year (date may be approximate).     Assessment:    This is a routine wellness examination for Carlos Hickman.  Hearing/Vision screen Hearing Screening - Comments:: Patient has no hearing problems. Vision Screening - Comments:: Patient has no vision problems. Patient had eye exam January 2023. [facility]  Dietary issues and exercise activities discussed: Current Exercise Habits: Home exercise routine, Type of exercise: Other - see comments (bike), Time (Minutes): 60, Frequency (Times/Week): 7, Weekly Exercise (Minutes/Week): 420   Goals Addressed   None    Depression Screen    04/07/2022   11:06 AM 07/12/2021    8:04 AM 04/05/2021    8:57 AM 07/02/2020    7:42 AM 07/01/2019    9:00 AM 06/25/2018    9:02 AM 04/01/2016    8:29 AM  PHQ 2/9 Scores  PHQ - 2 Score 0 0 0 0 0 0 0    Fall Risk    04/07/2022   11:07 AM 07/12/2021    8:04 AM 04/05/2021    8:56 AM 07/02/2020    7:42 AM 07/01/2019    9:00 AM  Fall Risk   Falls in the past year? 0 0 0 0 0  Number falls in past yr: 0 0 0 0 0  Injury with Fall? 0 0 0 0 0  Risk for fall due to : No Fall Risks No Fall Risks No Fall Risks    Follow up Falls evaluation completed Falls evaluation completed Falls evaluation completed      FALL RISK PREVENTION PERTAINING TO THE HOME:  Any stairs in or around the home? Yes  If so, are there any without handrails? No  Home free of loose throw rugs in walkways, pet beds, electrical cords, etc? Yes  Adequate lighting in your home to reduce risk of falls? Yes   ASSISTIVE DEVICES UTILIZED TO PREVENT FALLS:  Life alert? Yes  Use of a cane, walker or w/c? No  Grab bars in the bathroom? No  Shower chair or bench in shower? No  Elevated toilet seat or a handicapped toilet? Yes   TIMED UP AND GO:  Was the test performed? No .    Cognitive Function:    04/05/2021    9:01 AM  MMSE - Mini Mental State Exam  Orientation to time 5  Orientation to Place 5  Registration 3  Attention/ Calculation 5  Recall 3  Language- name 2 objects 2   Language- repeat 1  Language- follow 3 step command 3  Language- read & follow direction 1  Write a sentence 1  Copy design 1  Total score 30        04/07/2022   11:08 AM  6CIT Screen  What Year? 0 points  What month? 0 points  What time? 0 points  Count back from 20 0 points  Months in reverse 0 points  Repeat phrase 0 points  Total Score 0 points    Immunizations Immunization History  Administered Date(s) Administered   Fluad Quad(high Dose 65+) 03/22/2021   Influenza, Quadrivalent, Recombinant, Inj, Pf 03/18/2018   Influenza,inj,Quad PF,6+ Mos 03/24/2014, 02/26/2016, 03/18/2018, 04/09/2019   Influenza-Unspecified 03/20/2013, 03/24/2015, 03/25/2017, 04/12/2017, 03/13/2020, 02/09/2022   PFIZER Comirnaty(Gray Top)Covid-19 Tri-Sucrose Vaccine 07/15/2019, 08/05/2019,  04/12/2020   PFIZER(Purple Top)SARS-COV-2 Vaccination 09/19/2020, 02/15/2021   PNEUMOCOCCAL CONJUGATE-20 04/05/2021   Pfizer Covid-19 Vaccine Bivalent Booster 35yr & up 02/20/2021, 09/13/2021, 02/09/2022   Tdap 03/24/2015    TDAP status: Up to date  Flu Vaccine status: Up to date  Pneumococcal vaccine status: Up to date  Covid-19 vaccine status: Information provided on how to obtain vaccines.   Qualifies for Shingles Vaccine? Yes   Zostavax completed Yes   Shingrix Completed?: No.    Education has been provided regarding the importance of this vaccine. Patient has been advised to call insurance company to determine out of pocket expense if they have not yet received this vaccine. Advised may also receive vaccine at local pharmacy or Health Dept. Verbalized acceptance and understanding.  Screening Tests Health Maintenance  Topic Date Due   Zoster Vaccines- Shingrix (1 of 2) Never done   Medicare Annual Wellness (AWV)  04/05/2022   TETANUS/TDAP  03/23/2025   COLONOSCOPY (Pts 45-449yrInsurance coverage will need to be confirmed)  04/29/2026   Pneumonia Vaccine 6588Years old  Completed   INFLUENZA  VACCINE  Completed   COVID-19 Vaccine  Completed   Hepatitis C Screening  Completed   HPV VACCINES  Aged Out    Health Maintenance  Health Maintenance Due  Topic Date Due   Zoster Vaccines- Shingrix (1 of 2) Never done   Medicare Annual Wellness (AWV)  04/05/2022    Colorectal cancer screening: Type of screening: Colonoscopy. Completed 2022. Repeat every 5 years  Lung Cancer Screening: (Low Dose CT Chest recommended if Age 66-80ears, 30 pack-year currently smoking OR have quit w/in 15years.) does not qualify.   Lung Cancer Screening Referral: na  Additional Screening:  Hepatitis C Screening: does qualify; Completed 2017  Vision Screening: Recommended annual ophthalmology exams for early detection of glaucoma and other disorders of the eye. Is the patient up to date with their annual eye exam?  Yes  Who is the provider or what is the name of the office in which the patient attends annual eye exams? [facility] If pt is not established with a provider, would they like to be referred to a provider to establish care? No .   Dental Screening: Recommended annual dental exams for proper oral hygiene  Community Resource Referral / Chronic Care Management: CRR required this visit?  No   CCM required this visit?  No      Plan:     I have personally reviewed and noted the following in the patient's chart:   Medical and social history Use of alcohol, tobacco or illicit drugs  Current medications and supplements including opioid prescriptions. Patient is not currently taking opioid prescriptions. Functional ability and status Nutritional status Physical activity Advanced directives List of other physicians Hospitalizations, surgeries, and ER visits in previous 12 months Vitals Screenings to include cognitive, depression, and falls Referrals and appointments  In addition, I have reviewed and discussed with patient certain preventive protocols, quality metrics, and best  practice recommendations. A written personalized care plan for preventive services as well as general preventive health recommendations were provided to patient.     JeLauree ChandlerNP   04/07/2022    Virtual Visit via Telephone Note  I connected with patient 04/07/22 at 11:00 AM EST by telephone and verified that I am speaking with the correct person using two identifiers.  Location: Patient: home Provider: [facility]    I discussed the limitations, risks, security and privacy concerns of  performing an evaluation and management service by telephone and the availability of in person appointments. I also discussed with the patient that there may be a patient responsible charge related to this service. The patient expressed understanding and agreed to proceed.   I discussed the assessment and treatment plan with the patient. The patient was provided an opportunity to ask questions and all were answered. The patient agreed with the plan and demonstrated an understanding of the instructions.   The patient was advised to call back or seek an in-person evaluation if the symptoms worsen or if the condition fails to improve as anticipated.  I provided 14 minutes of non-face-to-face time during this encounter.  Carlos American. Harle Battiest Avs printed and mailed

## 2022-04-07 NOTE — Patient Instructions (Signed)
Carlos Hickman , Thank you for taking time to come for your Medicare Wellness Visit. I appreciate your ongoing commitment to your health goals. Please review the following plan we discussed and let me know if I can assist you in the future.   Screening recommendations/referrals: Colonoscopy up to date Recommended yearly ophthalmology/optometry visit for glaucoma screening and checkup Recommended yearly dental visit for hygiene and checkup  Vaccinations: Influenza vaccine due annually in September/October Pneumococcal vaccine up to date Tdap vaccine up to date Shingles vaccine due- can get at your local pharmacy     Advanced directives: on file.   Conditions/risks identified: advanced age.   Next appointment: yearly   Preventive Care 66 Years and Older, Male Preventive care refers to lifestyle choices and visits with your health care provider that can promote health and wellness. What does preventive care include? A yearly physical exam. This is also called an annual well check. Dental exams once or twice a year. Routine eye exams. Ask your health care provider how often you should have your eyes checked. Personal lifestyle choices, including: Daily care of your teeth and gums. Regular physical activity. Eating a healthy diet. Avoiding tobacco and drug use. Limiting alcohol use. Practicing safe sex. Taking low doses of aspirin every day. Taking vitamin and mineral supplements as recommended by your health care provider. What happens during an annual well check? The services and screenings done by your health care provider during your annual well check will depend on your age, overall health, lifestyle risk factors, and family history of disease. Counseling  Your health care provider may ask you questions about your: Alcohol use. Tobacco use. Drug use. Emotional well-being. Home and relationship well-being. Sexual activity. Eating habits. History of falls. Memory and ability  to understand (cognition). Work and work Statistician. Screening  You may have the following tests or measurements: Height, weight, and BMI. Blood pressure. Lipid and cholesterol levels. These may be checked every 5 years, or more frequently if you are over 45 years old. Skin check. Lung cancer screening. You may have this screening every year starting at age 33 if you have a 30-pack-year history of smoking and currently smoke or have quit within the past 15 years. Fecal occult blood test (FOBT) of the stool. You may have this test every year starting at age 72. Flexible sigmoidoscopy or colonoscopy. You may have a sigmoidoscopy every 5 years or a colonoscopy every 10 years starting at age 59. Prostate cancer screening. Recommendations will vary depending on your family history and other risks. Hepatitis C blood test. Hepatitis B blood test. Sexually transmitted disease (STD) testing. Diabetes screening. This is done by checking your blood sugar (glucose) after you have not eaten for a while (fasting). You may have this done every 1-3 years. Abdominal aortic aneurysm (AAA) screening. You may need this if you are a current or former smoker. Osteoporosis. You may be screened starting at age 46 if you are at high risk. Talk with your health care provider about your test results, treatment options, and if necessary, the need for more tests. Vaccines  Your health care provider may recommend certain vaccines, such as: Influenza vaccine. This is recommended every year. Tetanus, diphtheria, and acellular pertussis (Tdap, Td) vaccine. You may need a Td booster every 10 years. Zoster vaccine. You may need this after age 46. Pneumococcal 13-valent conjugate (PCV13) vaccine. One dose is recommended after age 4. Pneumococcal polysaccharide (PPSV23) vaccine. One dose is recommended after age 69. Talk to your  health care provider about which screenings and vaccines you need and how often you need  them. This information is not intended to replace advice given to you by your health care provider. Make sure you discuss any questions you have with your health care provider. Document Released: 06/05/2015 Document Revised: 01/27/2016 Document Reviewed: 03/10/2015 Elsevier Interactive Patient Education  2017 [place] Prevention in the Home Falls can cause injuries. They can happen to people of all ages. There are many things you can do to make your home safe and to help prevent falls. What can I do on the outside of my home? Regularly fix the edges of walkways and driveways and fix any cracks. Remove anything that might make you trip as you walk through a door, such as a raised step or threshold. Trim any bushes or trees on the path to your home. Use bright outdoor lighting. Clear any walking paths of anything that might make someone trip, such as rocks or tools. Regularly check to see if handrails are loose or broken. Make sure that both sides of any steps have handrails. Any raised decks and porches should have guardrails on the edges. Have any leaves, snow, or ice cleared regularly. Use sand or salt on walking paths during winter. Clean up any spills in your garage right away. This includes oil or grease spills. What can I do in the bathroom? Use night lights. Install grab bars by the toilet and in the tub and shower. Do not use towel bars as grab bars. Use non-skid mats or decals in the tub or shower. If you need to sit down in the shower, use a plastic, non-slip stool. Keep the floor dry. Clean up any water that spills on the floor as soon as it happens. Remove soap buildup in the tub or shower regularly. Attach bath mats securely with double-sided non-slip rug tape. Do not have throw rugs and other things on the floor that can make you trip. What can I do in the bedroom? Use night lights. Make sure that you have a light by your bed that is easy to reach. Do not use  any sheets or blankets that are too big for your bed. They should not hang down onto the floor. Have a firm chair that has side arms. You can use this for support while you get dressed. Do not have throw rugs and other things on the floor that can make you trip. What can I do in the kitchen? Clean up any spills right away. Avoid walking on wet floors. Keep items that you use a lot in easy-to-reach places. If you need to reach something above you, use a strong step stool that has a grab bar. Keep electrical cords out of the way. Do not use floor polish or wax that makes floors slippery. If you must use wax, use non-skid floor wax. Do not have throw rugs and other things on the floor that can make you trip. What can I do with my stairs? Do not leave any items on the stairs. Make sure that there are handrails on both sides of the stairs and use them. Fix handrails that are broken or loose. Make sure that handrails are as long as the stairways. Check any carpeting to make sure that it is firmly attached to the stairs. Fix any carpet that is loose or worn. Avoid having throw rugs at the top or bottom of the stairs. If you do have throw rugs, attach them  to the floor with carpet tape. Make sure that you have a light switch at the top of the stairs and the bottom of the stairs. If you do not have them, ask someone to add them for you. What else can I do to help prevent falls? Wear shoes that: Do not have high heels. Have rubber bottoms. Are comfortable and fit you well. Are closed at the toe. Do not wear sandals. If you use a stepladder: Make sure that it is fully opened. Do not climb a closed stepladder. Make sure that both sides of the stepladder are locked into place. Ask someone to hold it for you, if possible. Clearly mark and make sure that you can see: Any grab bars or handrails. First and last steps. Where the edge of each step is. Use tools that help you move around (mobility aids)  if they are needed. These include: Canes. Walkers. Scooters. Crutches. Turn on the lights when you go into a dark area. Replace any light bulbs as soon as they burn out. Set up your furniture so you have a clear path. Avoid moving your furniture around. If any of your floors are uneven, fix them. If there are any pets around you, be aware of where they are. Review your medicines with your doctor. Some medicines can make you feel dizzy. This can increase your chance of falling. Ask your doctor what other things that you can do to help prevent falls. This information is not intended to replace advice given to you by your health care provider. Make sure you discuss any questions you have with your health care provider. Document Released: 03/05/2009 Document Revised: 10/15/2015 Document Reviewed: 06/13/2014 Elsevier Interactive Patient Education  2017 [employer].

## 2022-04-07 NOTE — Telephone Encounter (Signed)
Mr. norvell, caswell are scheduled for a virtual visit with your provider today.    Just as we do with appointments in the office, we must obtain your consent to participate.  Your consent will be active for this visit and any virtual visit you may have with one of our providers in the next 365 days.    If you have a MyChart account, I can also send a copy of this consent to you electronically.  All virtual visits are billed to your insurance company just like a traditional visit in the office.  As this is a virtual visit, video technology does not allow for your provider to perform a traditional examination.  This may limit your provider's ability to fully assess your condition.  If your provider identifies any concerns that need to be evaluated in person or the need to arrange testing such as labs, EKG, etc, we will make arrangements to do so.    Although advances in technology are sophisticated, we cannot ensure that it will always work on either your end or our end.  If the connection with a video visit is poor, we may have to switch to a telephone visit.  With either a video or telephone visit, we are not always able to ensure that we have a secure connection.   I need to obtain your verbal consent now.   Are you willing to proceed with your visit today?   ESGAR BARNICK has provided verbal consent on 04/07/2022 for a virtual visit (video or telephone).   Carroll Kinds, CMA 04/07/2022  11:11 AM

## 2022-04-07 NOTE — Progress Notes (Signed)
This service is provided via telemedicine  No vital signs collected/recorded due to the encounter was a telemedicine visit.   Location of patient (ex: home, work):  Home  Patient consents to a telephone visit:  Yes, see encounter dated 04/07/2022  Location of the provider (ex: office, home):  [place]  Name of any referring provider:  N/A  Names of all persons participating in the telemedicine service and their role in the encounter:  Sherrie Mustache, Nurse Practitioner, Carroll Kinds, CMA, and patient.   Time spent on call:  9 minutes with medical assistant

## 2022-04-22 NOTE — Progress Notes (Unsigned)
[place] Phone: [phone] Subjective:   [name], am serving as a scribe for Dr. Hulan Saas.  I'm seeing this patient by the request  of:  Lauree Chandler, NP  CC: Right shoulder pain  [id number]  03/14/2022 Injection given today.  Did have arthritic changes noted.  Improvement in range of motion and strength immediately after injection which makes me feel more optimistic that patient will do well with conservative therapy.  We will get patient in with formal physical therapy which I think will be helpful.  X-rays are pending.  Discussed ibuprofen 200 mg as needed.  Follow-up with me again in 6 to 8 weeks      Update 04/25/2022 Carlos Hickman is a 66 y.o. male coming in with complaint of R shoulder pain.  Patient was seen a month ago and did have acromioclavicular arthritis as well as a rotator cuff tear.  Patient was given injection and home exercises.  Patient states that he is having less shooting pain but still has pain with flexion and reaching out. Pain is less but still present. Has been yoga and exercises.        Past Medical History:  Diagnosis Date   Allergy    Anxiety    BPH (benign prostatic hyperplasia)    Depression    Diverticulosis    Heart murmur    Hyperlipidemia    Mitral valve prolapse    Past Surgical History:  Procedure Laterality Date   COLONOSCOPY  04/29/2021   and 2019 with Scarlette Shorts at [facility] History   Socioeconomic History   Marital status: Married    Spouse name: Not on file   Number of children: Not on file   Years of education: Not on file   Highest education level: Not on file  Occupational History   Not on file  Tobacco Use   Smoking status: Former    Types: Cigarettes    Quit date: 11/10/2014    Years since quitting: 7.4   Smokeless tobacco: Never  Vaping Use   Vaping Use: Never used  Substance and  Sexual Activity   Alcohol use: Yes    Alcohol/week: 5.0 - 7.0 standard drinks of alcohol    Types: 5 - 7 Standard drinks or equivalent per week    Comment: 3 or 4 times a week   Drug use: No   Sexual activity: Not on file  Other Topics Concern   Not on file  Social History Narrative   Diet: Regular      Do you drink/ eat things with caffeine? Yes      Marital status:   Maried                            What year were you married ? 1983      Do you live in a house, apartment,assistred living, condo, trailer, etc.)? House      Is it one or more stories?  No      How many persons live in your home ?  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 Occasionally  Type & how often: Walk,Run Bike      Do you have a living will? No      Do you have a DNR form?   No                    If not, do you want to discuss one? Will do so      Do you have signed POA?HPOA forms? No                If so, please bring to your        appointment         Social Determinants of Health   Financial Resource Strain: Not on file  Food Insecurity: Not on file  Transportation Needs: Not on file  Physical Activity: Not on file  Stress: Not on file  Social Connections: Not on file   No Known Allergies Family History  Problem Relation Age of Onset   Diabetes Mother    Dementia Mother    Stroke Father    Colon cancer Paternal Aunt    Esophageal cancer Neg Hx    Liver cancer Neg Hx    Pancreatic cancer Neg Hx    Rectal cancer Neg Hx    Stomach cancer Neg Hx    Colon polyps Neg Hx       Current Outpatient Medications (Respiratory):    cetirizine (ZYRTEC) 10 MG tablet, Take 10 mg by mouth as needed for allergies.  Current Outpatient Medications (Analgesics):    ibuprofen (ADVIL) 200 MG tablet, Take 200 mg by mouth as needed.   Current Outpatient Medications (Other):     Calcium Carb-Cholecalciferol (CALTRATE 600+D3) 600-800 MG-UNIT TABS, Take 1 tablet by mouth daily.   gabapentin (NEURONTIN) 100 MG capsule, Take 2 capsules (200 mg total) by mouth at bedtime.   Reviewed prior external information including notes and imaging from  primary care provider As well as notes that were available from care everywhere and other healthcare systems.  Past medical history, social, surgical and family history all reviewed in electronic medical record.  No pertanent information unless stated regarding to the chief complaint.   Review of Systems:  No headache, visual changes, nausea, vomiting, diarrhea, constipation, dizziness, abdominal pain, skin rash, fevers, chills, night sweats, weight loss, swollen lymph nodes, body aches, joint swelling, chest pain, shortness of breath, mood changes. POSITIVE muscle aches  Objective  Blood pressure (!) 140/80, pulse (!) 44, height 6' (1.829 m), weight 167 lb (75.8 kg), SpO2 98 %.   General: No apparent distress alert and oriented x3 mood and affect normal, dressed appropriately.  HEENT: Pupils equal, extraocular movements intact  Respiratory: Patient's speak in full sentences and does not appear short of breath  Cardiovascular: No lower extremity edema, non tender, no erythema  Right shoulder exam shows still some weakness noted of the rotator cuff on empty can.  Patient does have some tightness noted.  Tightness with FABER test noted as well.  Negative straight leg test.  Limited muscular skeletal ultrasound was performed and interpreted by Hulan Saas, M  Limited ultrasound shows the patient actually does have some improvement in the rotator cuff itself.  Significant hypoechoic changes still noted of the acromioclavicular joint with mild to moderate narrowing noted.  Significant decrease in the hypoechoic changes of the bicep tendon noted. Impression: Interval improvement of the rotator cuff but still acromioclavicular  arthritis.  Procedure: Real-time Ultrasound Guided Injection of right acromioclavicular joint Device: GE Logiq Q7  Ultrasound guided injection is preferred based studies that show increased duration, increased effect, greater accuracy, decreased procedural pain, increased response rate, and decreased cost with ultrasound guided versus blind injection.  Verbal informed consent obtained.  Time-out conducted.  Noted no overlying erythema, induration, or other signs of local infection.  Skin prepped in a sterile fashion.  Local anesthesia: Topical Ethyl chloride.  With sterile technique and under real time ultrasound guidance: With a 25-gauge half inch needle injected with 0.5 cc of 0.5% Marcaine and 0.5 cc of Kenalog 40 mg/mL Completed without difficulty  Pain immediately resolved suggesting accurate placement of the medication.  Advised to call if fevers/chills, erythema, induration, drainage, or persistent bleeding.  Impression: Technically successful ultrasound guided injection.    Impression and Recommendations:     The above documentation has been reviewed and is accurate and complete Carlos Pulley, DO

## 2022-04-25 ENCOUNTER — Ambulatory Visit: Payer: Medicare Other | Admitting: Family Medicine

## 2022-04-25 ENCOUNTER — Encounter: Payer: Self-pay | Admitting: Family Medicine

## 2022-04-25 ENCOUNTER — Ambulatory Visit: Payer: Self-pay

## 2022-04-25 VITALS — BP 140/80 | HR 44 | Ht 72.0 in | Wt 167.0 lb

## 2022-04-25 DIAGNOSIS — M75121 Complete rotator cuff tear or rupture of right shoulder, not specified as traumatic: Secondary | ICD-10-CM | POA: Diagnosis not present

## 2022-04-25 DIAGNOSIS — M25511 Pain in right shoulder: Secondary | ICD-10-CM

## 2022-04-25 DIAGNOSIS — M19011 Primary osteoarthritis, right shoulder: Secondary | ICD-10-CM | POA: Diagnosis not present

## 2022-04-25 MED ORDER — GABAPENTIN 100 MG PO CAPS: 200.0000 mg | ORAL_CAPSULE | Freq: Every day | ORAL | 0 refills | Status: DC

## 2022-04-25 NOTE — Assessment & Plan Note (Signed)
On ultrasound it appeared that patient was doing better overall.  Does still have some hypoechoic changes noted.  We will continue to monitor and once again if does not respond to this treatment course we do need to consider advanced imaging.

## 2022-04-25 NOTE — Assessment & Plan Note (Signed)
Hopeful that this will be beneficial.  Discussed with patient that if this makes a significant improvement hopefully he will do well with conservative therapy.  Worsening symptoms do need to consider the possibility of advanced imaging with an MRI with patient already failing conservative therapy with formal physical therapy, home exercises and icing regimen.  Follow-up with me again in 6 to 8 weeks

## 2022-04-25 NOTE — Patient Instructions (Signed)
Gabapentin '200mg'$  at night No overhead lifting See me in 6 weeks

## 2022-05-31 NOTE — Progress Notes (Unsigned)
[place] Phone: [phone] Subjective:   [name], am serving as a scribe for Dr. Hulan Saas.  I'm seeing this patient by the request  of:  Lauree Chandler, NP  CC: right shoulder pain follow up   VOZ:[id number]  04/25/2022 On ultrasound it appeared that patient was doing better overall.  Does still have some hypoechoic changes noted.  We will continue to monitor and once again if does not respond to this treatment course we do need to consider advanced imaging.     Hopeful that this will be beneficial.  Discussed with patient that if this makes a significant improvement hopefully he will do well with conservative therapy.  Worsening symptoms do need to consider the possibility of advanced imaging with an MRI with patient already failing conservative therapy with formal physical therapy, home exercises and icing regimen.  Follow-up with me again in 6 to 8 weeks     Update 06/06/2022 Carlos Hickman is a 67 y.o. male coming in with complaint of R shoulder pain. Patient states that he continues to have pain with little improvement. Pain localized to anterior shoulder joint. Denies any radiating symptoms.  States still affecting daily activities        Past Medical History:  Diagnosis Date   Allergy    Anxiety    BPH (benign prostatic hyperplasia)    Depression    Diverticulosis    Heart murmur    Hyperlipidemia    Mitral valve prolapse    Past Surgical History:  Procedure Laterality Date   COLONOSCOPY  04/29/2021   and 2019 with Scarlette Shorts at [place] History   Socioeconomic History   Marital status: Married    Spouse name: Not on file   Number of children: Not on file   Years of education: Not on file   Highest education level: Not on file  Occupational History   Not on file  Tobacco Use   Smoking status: Former    Types: Cigarettes    Quit date:  11/10/2014    Years since quitting: 7.5   Smokeless tobacco: Never  Vaping Use   Vaping Use: Never used  Substance and Sexual Activity   Alcohol use: Yes    Alcohol/week: 5.0 - 7.0 standard drinks of alcohol    Types: 5 - 7 Standard drinks or equivalent per week    Comment: 3 or 4 times a week   Drug use: No   Sexual activity: Not on file  Other Topics Concern   Not on file  Social History Narrative   Diet: Regular      Do you drink/ eat things with caffeine? Yes      Marital status:   Maried                            What year were you married ? 1983      Do you live in a house, apartment,assistred living, condo, trailer, etc.)? House      Is it one or more stories?  No      How many persons live in your home ?  1      Do you have any pets in your home ?(please list) No      Current or past profession: Freight forwarder at [employer]  Do you exercise? Occasionally                               Type & how often: Walk,Run Bike      Do you have a living will? No      Do you have a DNR form?   No                    If not, do you want to discuss one? Will do so      Do you have signed POA?HPOA forms? No                If so, please bring to your        appointment         Social Determinants of Health   Financial Resource Strain: Not on file  Food Insecurity: Not on file  Transportation Needs: Not on file  Physical Activity: Not on file  Stress: Not on file  Social Connections: Not on file   No Known Allergies Family History  Problem Relation Age of Onset   Diabetes Mother    Dementia Mother    Stroke Father    Colon cancer Paternal Aunt    Esophageal cancer Neg Hx    Liver cancer Neg Hx    Pancreatic cancer Neg Hx    Rectal cancer Neg Hx    Stomach cancer Neg Hx    Colon polyps Neg Hx       Current Outpatient Medications (Respiratory):    cetirizine (ZYRTEC) 10 MG tablet, Take 10 mg by mouth as needed for allergies.  Current Outpatient Medications  (Analgesics):    ibuprofen (ADVIL) 200 MG tablet, Take 200 mg by mouth as needed.   Current Outpatient Medications (Other):    Calcium Carb-Cholecalciferol (CALTRATE 600+D3) 600-800 MG-UNIT TABS, Take 1 tablet by mouth daily.   gabapentin (NEURONTIN) 100 MG capsule, Take 2 capsules (200 mg total) by mouth at bedtime.   Reviewed prior external information including notes and imaging from  primary care provider As well as notes that were available from care everywhere and other healthcare systems.  Past medical history, social, surgical and family history all reviewed in electronic medical record.  No pertanent information unless stated regarding to the chief complaint.   Review of Systems:  No headache, visual changes, nausea, vomiting, diarrhea, constipation, dizziness, abdominal pain, skin rash, fevers, chills, night sweats, weight loss, swollen lymph nodes, body aches, joint swelling, chest pain, shortness of breath, mood changes. POSITIVE muscle aches  Objective  Blood pressure 128/78, pulse 62, height 6' (1.829 m), weight 172 lb (78 kg), SpO2 98 %.   General: No apparent distress alert and oriented x3 mood and affect normal, dressed appropriately.  HEENT: Pupils equal, extraocular movements intact  Respiratory: Patient's speak in full sentences and does not appear short of breath  Cardiovascular: No lower extremity edema, non tender, no erythema  Shoulder still has positive impingement and positive obriens mild weakness 3/5 strength of the RTC as well   Limited muscular skeletal ultrasound was performed and interpreted by Hulan Saas, M  Limited US shows calcific changes of posterior labrum, mild hypoechoic changes noted  No tearing specifically noted of the SST  Impression: abnormality of labrum and hypoechoic changes of GHJ      Impression and Recommendations:    The above documentation has been reviewed and is accurate and complete Olevia Bowens  Tamala Julian, DO

## 2022-06-06 ENCOUNTER — Ambulatory Visit: Payer: Self-pay

## 2022-06-06 ENCOUNTER — Ambulatory Visit: Payer: Medicare Other | Admitting: Family Medicine

## 2022-06-06 VITALS — BP 128/78 | HR 62 | Ht 72.0 in | Wt 172.0 lb

## 2022-06-06 DIAGNOSIS — M25511 Pain in right shoulder: Secondary | ICD-10-CM | POA: Diagnosis not present

## 2022-06-06 DIAGNOSIS — M75121 Complete rotator cuff tear or rupture of right shoulder, not specified as traumatic: Secondary | ICD-10-CM | POA: Diagnosis not present

## 2022-06-06 NOTE — Patient Instructions (Signed)
MRA [phone]

## 2022-06-07 ENCOUNTER — Encounter: Payer: Self-pay | Admitting: Family Medicine

## 2022-06-07 NOTE — Assessment & Plan Note (Signed)
No improvement, worsening pain, failed all conservative therapy including, injection, NSAIDS, PT with worsening weakness.  MRA ordered today to further evaluate

## 2022-06-24 ENCOUNTER — Ambulatory Visit
Admission: RE | Admit: 2022-06-24 | Discharge: 2022-06-24 | Disposition: A | Discharge status: Home / Self Care | Payer: Medicare Other | Source: Ambulatory Visit | Attending: Family Medicine | Admitting: Family Medicine

## 2022-06-24 DIAGNOSIS — M25511 Pain in right shoulder: Secondary | ICD-10-CM

## 2022-06-24 MED ORDER — IOPAMIDOL (ISOVUE-M 200) INJECTION 41%
12.0000 mL | Freq: Once | INTRAMUSCULAR | Status: AC
  Administered 2022-06-24: 12 mL via INTRA_ARTICULAR

## 2022-06-27 ENCOUNTER — Encounter: Payer: Self-pay | Admitting: Family Medicine

## 2022-06-29 NOTE — Progress Notes (Unsigned)
[place] Phone: [phone] Subjective:   Carlos Hickman, am serving as a scribe for Dr. Hulan Saas.  I'm seeing this patient by the request  of:  Lauree Chandler, NP  CC: Right shoulder pain follow-up  [id number]  06/06/2022 Hickman improvement, worsening pain, failed all conservative therapy including, injection, NSAIDS, PT with worsening weakness.  MRA ordered today to further evaluate  Updated 06/30/2022 Carlos Hickman is a 67 y.o. male coming in with complaint of R shoulder pain. Here for PRP. Pain worsening since getting MRI. Pain felt with abducting arm.        Past Medical History:  Diagnosis Date   Allergy    Anxiety    BPH (benign prostatic hyperplasia)    Depression    Diverticulosis    Heart murmur    Hyperlipidemia    Mitral valve prolapse    Past Surgical History:  Procedure Laterality Date   COLONOSCOPY  04/29/2021   and 2019 with Scarlette Shorts at [place] History   Socioeconomic History   Marital status: Married    Spouse name: Not on file   Number of children: Not on file   Years of education: Not on file   Highest education level: Not on file  Occupational History   Not on file  Tobacco Use   Smoking status: Former    Types: Cigarettes    Quit date: 11/10/2014    Years since quitting: 7.6   Smokeless tobacco: Never  Vaping Use   Vaping Use: Never used  Substance and Sexual Activity   Alcohol use: Yes    Alcohol/week: 5.0 - 7.0 standard drinks of alcohol    Types: 5 - 7 Standard drinks or equivalent per week    Comment: 3 or 4 times a week   Drug use: Hickman   Sexual activity: Not on file  Other Topics Concern   Not on file  Social History Narrative   Diet: Regular      Do you drink/ eat things with caffeine? Yes      Marital status:   Maried                            What year were you married ? 1983      Do you live in a  house, apartment,assistred living, condo, trailer, etc.)? House      Is it one or more stories?  Hickman      How many persons live in your home ?  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 No      Do you have a DNR form?   Hickman                    If not, do you want to discuss one? Will do so      Do you have signed POA?HPOA forms? Hickman  If so, please bring to your        appointment         Social Determinants of Health   Financial Resource Strain: Not on file  Food Insecurity: Not on file  Transportation Needs: Not on file  Physical Activity: Not on file  Stress: Not on file  Social Connections: Not on file   Hickman Known Allergies Family History  Problem Relation Age of Onset   Diabetes Mother    Dementia Mother    Stroke Father    Colon cancer Paternal Aunt    Esophageal cancer Neg Hx    Liver cancer Neg Hx    Pancreatic cancer Neg Hx    Rectal cancer Neg Hx    Stomach cancer Neg Hx    Colon polyps Neg Hx       Current Outpatient Medications (Respiratory):    cetirizine (ZYRTEC) 10 MG tablet, Take 10 mg by mouth as needed for allergies.  Current Outpatient Medications (Analgesics):    ibuprofen (ADVIL) 200 MG tablet, Take 200 mg by mouth as needed.   Current Outpatient Medications (Other):    Calcium Carb-Cholecalciferol (CALTRATE 600+D3) 600-800 MG-UNIT TABS, Take 1 tablet by mouth daily.   gabapentin (NEURONTIN) 100 MG capsule, Take 2 capsules (200 mg total) by mouth at bedtime.   Reviewed prior external information including notes and imaging from  primary care provider As well as notes that were available from care everywhere and other healthcare systems.  Past medical history, social, surgical and family history all reviewed in electronic medical  record.  Hickman pertanent information unless stated regarding to the chief complaint.   Review of Systems:  Hickman headache, visual changes, nausea, vomiting, diarrhea, constipation, dizziness, abdominal pain, skin rash, fevers, chills, night sweats, weight loss, swollen lymph nodes, body aches, joint swelling, chest pain, shortness of breath, mood changes. POSITIVE muscle aches  Objective  Blood pressure 120/82, pulse 69, height 6' (1.829 m), weight 171 lb (77.6 kg), SpO2 98 %.   General: Hickman apparent distress alert and oriented x3 mood and affect normal, dressed appropriately.    Procedure: Real-time Ultrasound Guided Injection of right supraspinatus tendon sheath Device: GE Logiq Q7  Ultrasound guided injection is preferred based studies that show increased duration, increased effect, greater accuracy, decreased procedural pain, increased response rate with ultrasound guided versus blind injection.  Verbal informed consent obtained.  Time-out conducted.  Noted Hickman overlying erythema, induration, or other signs of local infection.  Skin prepped in a sterile fashion.  Local anesthesia: Topical Ethyl chloride.  With sterile technique and under real time ultrasound guidance:  Joint visualized.  23g 1  inch needle inserted posterior approach. Pictures taken for needle placement. Patient did have injection of 1 cc of 0.5% Marcaine with a 22-gauge 1 inch needle of the lateral posterior aspect of the shoulder under ultrasound guidance.  Then injected with 4 cc of PRP.  This was in the tendon sheath mostly of the posterior supraspinatus Completed without difficulty  Pain immediately resolved suggesting accurate placement of the medication.  Advised to call if fevers/chills, erythema, induration, drainage, or persistent bleeding.  Impression: Technically successful ultrasound guided injection.    Impression and Recommendations:     The above documentation has been reviewed and is accurate and complete  Lyndal Pulley, DO

## 2022-06-30 ENCOUNTER — Ambulatory Visit: Payer: Self-pay

## 2022-06-30 ENCOUNTER — Encounter: Payer: Self-pay | Admitting: Family Medicine

## 2022-06-30 ENCOUNTER — Ambulatory Visit: Payer: Self-pay | Admitting: Family Medicine

## 2022-06-30 VITALS — BP 120/82 | HR 69 | Ht 72.0 in | Wt 171.0 lb

## 2022-06-30 DIAGNOSIS — M75121 Complete rotator cuff tear or rupture of right shoulder, not specified as traumatic: Secondary | ICD-10-CM

## 2022-06-30 DIAGNOSIS — M25511 Pain in right shoulder: Secondary | ICD-10-CM

## 2022-06-30 NOTE — Assessment & Plan Note (Signed)
Patient given PRP today.  Post PRP instructions given.  Will follow-up with me again 6 weeks

## 2022-06-30 NOTE — Patient Instructions (Signed)
No ice or IBU for 3 days Heat and Tylenol are ok See me again in 6 weeks 

## 2022-07-14 ENCOUNTER — Other Ambulatory Visit: Payer: Self-pay | Admitting: Nurse Practitioner

## 2022-07-14 ENCOUNTER — Other Ambulatory Visit: Payer: Medicare Other

## 2022-07-14 ENCOUNTER — Other Ambulatory Visit: Payer: Self-pay

## 2022-07-14 DIAGNOSIS — E785 Hyperlipidemia, unspecified: Secondary | ICD-10-CM

## 2022-07-14 DIAGNOSIS — Z0000 Encounter for general adult medical examination without abnormal findings: Secondary | ICD-10-CM | POA: Diagnosis not present

## 2022-07-14 DIAGNOSIS — R739 Hyperglycemia, unspecified: Secondary | ICD-10-CM | POA: Diagnosis not present

## 2022-07-15 LAB — CBC WITH DIFFERENTIAL/PLATELET
Absolute Monocytes: 365 cells/uL (ref 200–950)
Basophils Absolute: 31 cells/uL (ref 0–200)
Basophils Relative: 0.7 %
Eosinophils Absolute: 70 cells/uL (ref 15–500)
Eosinophils Relative: 1.6 %
HCT: 42.7 % (ref 38.5–50.0)
Hemoglobin: 14.8 g/dL (ref 13.2–17.1)
Lymphs Abs: 1580 cells/uL (ref 850–3900)
MCH: 32.1 pg (ref 27.0–33.0)
MCHC: 34.7 g/dL (ref 32.0–36.0)
MCV: 92.6 fL (ref 80.0–100.0)
MPV: 12.1 fL (ref 7.5–12.5)
Monocytes Relative: 8.3 %
Neutro Abs: 2354 cells/uL (ref 1500–7800)
Neutrophils Relative %: 53.5 %
Platelets: 195 10*3/uL (ref 140–400)
RBC: 4.61 10*6/uL (ref 4.20–5.80)
RDW: 11.9 % (ref 11.0–15.0)
Total Lymphocyte: 35.9 %
WBC: 4.4 10*3/uL (ref 3.8–10.8)

## 2022-07-15 LAB — COMPLETE METABOLIC PANEL WITH GFR
AG Ratio: 1.9 (calc) (ref 1.0–2.5)
ALT: 15 U/L (ref 9–46)
AST: 20 U/L (ref 10–35)
Albumin: 4.4 g/dL (ref 3.6–5.1)
Alkaline phosphatase (APISO): 61 U/L (ref 35–144)
BUN: 15 mg/dL (ref 7–25)
CO2: 29 mmol/L (ref 20–32)
Calcium: 9.5 mg/dL (ref 8.6–10.3)
Chloride: 104 mmol/L (ref 98–110)
Creat: 1.03 mg/dL (ref 0.70–1.35)
Globulin: 2.3 g/dL (calc) (ref 1.9–3.7)
Glucose, Bld: 87 mg/dL (ref 65–99)
Potassium: 4.3 mmol/L (ref 3.5–5.3)
Sodium: 140 mmol/L (ref 135–146)
Total Bilirubin: 0.5 mg/dL (ref 0.2–1.2)
Total Protein: 6.7 g/dL (ref 6.1–8.1)
eGFR: 80 mL/min/{1.73_m2} (ref >=60)

## 2022-07-15 LAB — HEMOGLOBIN A1C
Hgb A1c MFr Bld: 5.8 % of total Hgb — ABNORMAL HIGH (ref <5.7)
Mean Plasma Glucose: 120 mg/dL
eAG (mmol/L): 6.6 mmol/L

## 2022-07-15 LAB — LIPID PANEL
Cholesterol: 193 mg/dL (ref <200)
HDL: 91 mg/dL (ref >=40)
LDL Cholesterol (Calc): 90 mg/dL (calc)
Non-HDL Cholesterol (Calc): 102 mg/dL (calc) (ref <130)
Total CHOL/HDL Ratio: 2.1 (calc) (ref <5.0)
Triglycerides: 40 mg/dL (ref <150)

## 2022-07-18 ENCOUNTER — Ambulatory Visit (INDEPENDENT_AMBULATORY_CARE_PROVIDER_SITE_OTHER): Payer: Medicare Other | Admitting: Nurse Practitioner

## 2022-07-18 ENCOUNTER — Encounter: Payer: Self-pay | Admitting: Nurse Practitioner

## 2022-07-18 DIAGNOSIS — Z87312 Personal history of (healed) stress fracture: Secondary | ICD-10-CM

## 2022-07-18 DIAGNOSIS — M25522 Pain in left elbow: Secondary | ICD-10-CM | POA: Diagnosis not present

## 2022-07-18 DIAGNOSIS — E785 Hyperlipidemia, unspecified: Secondary | ICD-10-CM

## 2022-07-18 DIAGNOSIS — M25511 Pain in right shoulder: Secondary | ICD-10-CM | POA: Diagnosis not present

## 2022-07-18 DIAGNOSIS — R739 Hyperglycemia, unspecified: Secondary | ICD-10-CM | POA: Diagnosis not present

## 2022-07-18 NOTE — Progress Notes (Signed)
Careteam: Patient Care Team: Lauree Chandler, NP as PCP - General (Geriatric Medicine)  PLACE OF SERVICE:  [place] Directive information Does Patient Have a Medical Advance Directive?: Yes, Type of Advance Directive: [place];Living will;Out of facility DNR (pink MOST or yellow form), Does patient want to make changes to medical advance directive?: No - Patient declined  No Known Allergies  Chief Complaint  Patient presents with   Medical Management of Chronic Issues    Patient here for follow up   Immunizations    Discussed the need for 2nd shingles vaccine      HPI: Patient is a 67 y.o. male here for routine follow-up.   He sees Dr. Tamala Julian for R shoulder pain. He had some steroid injections but didn't help, so now he is having platelet injections in that shoulder. Left elbow has now been bothering him and he has been prescribed gabapentin for this.   He is able to stay active and cycles for about an hour a day.  Eats pretty well, avoids processed foods, fatty foods, fried foods.   Mood is doing well. He's happy.  He wants to know if he should continue taking his calcium supplement. He was prescribed this years ago when he had a tibial stress fracture which he has since had no issues with.   Review of Systems:  Review of Systems  Constitutional:  Negative for chills, fever, malaise/fatigue and weight loss.  HENT:  Negative for congestion and sore throat.   Eyes:  Negative for blurred vision.  Respiratory:  Negative for cough, shortness of breath and wheezing.   Cardiovascular:  Negative for chest pain, palpitations and leg swelling.  Gastrointestinal:  Negative for abdominal pain, blood in stool, constipation, diarrhea, heartburn, nausea and vomiting.  Genitourinary:  Negative for dysuria, frequency, hematuria and urgency.  Musculoskeletal:  Positive for joint pain. Negative for falls.       L elbow R shoulder Sees ortho  Skin:   Negative for rash.  Neurological:  Negative for dizziness, tingling and headaches.  Endo/Heme/Allergies:  Negative for polydipsia.  Psychiatric/Behavioral:  Negative for depression. The patient is not nervous/anxious.     Past Medical History:  Diagnosis Date   Allergy    Anxiety    BPH (benign prostatic hyperplasia)    Depression    Diverticulosis    Heart murmur    Hyperlipidemia    Mitral valve prolapse    Past Surgical History:  Procedure Laterality Date   COLONOSCOPY  04/29/2021   and 2019 with Scarlette Shorts at [place] History:   reports that he quit smoking about 7 years ago. His smoking use included cigarettes. He has never used smokeless tobacco. He reports current alcohol use of about 5.0 - 7.0 standard drinks of alcohol per week. He reports that he does not use drugs.  Family History  Problem Relation Age of Onset   Diabetes Mother    Dementia Mother    Stroke Father    Colon cancer Paternal Aunt    Esophageal cancer Neg Hx    Liver cancer Neg Hx    Pancreatic cancer Neg Hx    Rectal cancer Neg Hx    Stomach cancer Neg Hx    Colon polyps Neg Hx     Medications: Patient's Medications  New Prescriptions   No medications on file  Previous Medications   CALCIUM CARB-CHOLECALCIFEROL (CALTRATE 600+D3) 600-800  MG-UNIT TABS    Take 1 tablet by mouth daily.   CETIRIZINE (ZYRTEC) 10 MG TABLET    Take 10 mg by mouth as needed for allergies.   FLUAD QUADRIVALENT 0.5 ML INJECTION    Inject 0.5 mLs into the muscle once.   GABAPENTIN (NEURONTIN) 100 MG CAPSULE    Take 2 capsules (200 mg total) by mouth at bedtime.   IBUPROFEN (ADVIL) 200 MG TABLET    Take 200 mg by mouth as needed.   SPIKEVAX INJECTION    Inject 0.5 mLs into the muscle once.  Modified Medications   No medications on file  Discontinued Medications   No medications on file    Physical Exam:  Vitals:   07/18/22 0820  BP: 126/84  Pulse: (!) 57  Resp: 17  Temp: 97.6  F (36.4 C)  TempSrc: Temporal  Weight: 173 lb (78.5 kg)  Height: 6' (1.829 m)   Body mass index is 23.46 kg/m. Wt Readings from Last 3 Encounters:  07/18/22 173 lb (78.5 kg)  06/30/22 171 lb (77.6 kg)  06/06/22 172 lb (78 kg)    Physical Exam Vitals and nursing note reviewed. Exam conducted with a chaperone present.  Cardiovascular:     Rate and Rhythm: Normal rate and regular rhythm.     Heart sounds: Normal heart sounds. No murmur heard. Pulmonary:     Breath sounds: Normal breath sounds. No wheezing or rales.  Abdominal:     General: Bowel sounds are normal.     Palpations: Abdomen is soft. There is no mass.     Tenderness: There is no abdominal tenderness. There is no guarding.  Musculoskeletal:        General: No swelling or tenderness.  Skin:    General: Skin is warm and dry.  Neurological:     Mental Status: He is alert and oriented to person, place, and time. Mental status is at baseline.  Psychiatric:        Mood and Affect: Mood normal.        Behavior: Behavior normal.        Judgment: Judgment normal.     Labs reviewed: Basic Metabolic Panel: Recent Labs    07/14/22 0819  NA 140  K 4.3  CL 104  CO2 29  GLUCOSE 87  BUN 15  CREATININE 1.03  CALCIUM 9.5   Liver Function Tests: Recent Labs    07/14/22 0819  AST 20  ALT 15  BILITOT 0.5  PROT 6.7   No results for input(s): "LIPASE", "AMYLASE" in the last 8760 hours. No results for input(s): "AMMONIA" in the last 8760 hours. CBC: Recent Labs    07/14/22 0819  WBC 4.4  NEUTROABS 2,354  HGB 14.8  HCT 42.7  MCV 92.6  PLT 195   Lipid Panel: Recent Labs    07/14/22 0819  CHOL 193  HDL 91  LDLCALC 90  TRIG 40  CHOLHDL 2.1   TSH: No results for input(s): "TSH" in the last 8760 hours. A1C: Lab Results  Component Value Date   HGBA1C 5.8 (H) 07/14/2022     Assessment/Plan 1. Pain of right shoulder joint on movement Patient follows up with orthopedics. Receiving injections.  Steroid injections did not help. He is now receiving platelet rich plasma injections and hopes it improves with that.   2. Left elbow pain Ortho prescribed gabapentin for nerve pain. Pt has not noticed much improvement thus far but continues to follow-up with ortho.   3. History  of stress fracture Pt can stop calcium at this time per preference; reports well-rounded diet.   4. Hyperlipidemia, unspecified hyperlipidemia type Stable. Monitor. Continue diet and lifestyle modifications.  5. Hyperglycemia Patient is active, normal weight, well-balanced healthy diet. Will Monitor    Return in about 1 year (around 07/19/2023) for yearly follow up, labs prior .  Student- Archer Asa O'Berry ACPCNP-S  I personally was present during the history, physical exam and medical decision-making activities of this service and have verified that the service and findings are accurately documented in the student's note [place] Kitner K. [place], [facility] [phone]

## 2022-08-10 NOTE — Progress Notes (Unsigned)
Carlos Hickman [address] [place] Phone: [phone] Subjective:   Carlos Hickman, am serving as a scribe for Dr. Hulan Saas.  I'm seeing this patient by the request  of:  Lauree Chandler, NP  CC: Right shoulder pain follow-up  [id number]  06/30/2022 Patient given PRP today. Post PRP instructions given. Will follow-up with me again 6 weeks   Updated 08/11/2022 Carlos Hickman is a 67 y.o. male coming in with complaint of shoulder pain. F/u PRP. Shoulder feels the same as it did pre PRP. Would like to know options moving forward.  States that initially did make some improvements.  Approximately 40% pain over the first 3 weeks and then slowly has now only about 10% better over the last couple weeks.    Past Medical History:  Diagnosis Date   Allergy    Anxiety    BPH (benign prostatic hyperplasia)    Depression    Diverticulosis    Heart murmur    Hyperlipidemia    Mitral valve prolapse    Past Surgical History:  Procedure Laterality Date   COLONOSCOPY  04/29/2021   and 2019 with Scarlette Shorts at [facility] History   Socioeconomic History   Marital status: Married    Spouse name: Not on file   Number of children: Not on file   Years of education: Not on file   Highest education level: Not on file  Occupational History   Not on file  Tobacco Use   Smoking status: Former    Types: Cigarettes    Quit date: 11/10/2014    Years since quitting: 7.7   Smokeless tobacco: Never  Vaping Use   Vaping Use: Never used  Substance and Sexual Activity   Alcohol use: Yes    Alcohol/week: 5.0 - 7.0 standard drinks of alcohol    Types: 5 - 7 Standard drinks or equivalent per week    Comment: 3 or 4 times a week   Drug use: No   Sexual activity: Not on file  Other Topics Concern   Not on file  Social History Narrative   Diet: Regular      Do you drink/ eat things with caffeine? Yes       Marital status:   Maried                            What year were you married ? 1983      Do you live in a house, apartment,assistred living, condo, trailer, etc.)? House      Is it one or more stories?  No      How many persons live in your home ?  1      Do you have any pets in your home ?(please list) No      Current or past profession: Freight forwarder at [employer] you exercise? Occasionally                               Type & how often: Walk,Run Bike      Do you have a living will? No      Do you have a DNR form?   No  If not, do you want to discuss one? Will do so      Do you have signed POA?HPOA forms? No                If so, please bring to your        appointment         Social Determinants of Health   Financial Resource Strain: Not on file  Food Insecurity: Not on file  Transportation Needs: Not on file  Physical Activity: Not on file  Stress: Not on file  Social Connections: Not on file   No Known Allergies Family History  Problem Relation Age of Onset   Diabetes Mother    Dementia Mother    Stroke Father    Colon cancer Paternal Aunt    Esophageal cancer Neg Hx    Liver cancer Neg Hx    Pancreatic cancer Neg Hx    Rectal cancer Neg Hx    Stomach cancer Neg Hx    Colon polyps Neg Hx       Current Outpatient Medications (Respiratory):    cetirizine (ZYRTEC) 10 MG tablet, Take 10 mg by mouth as needed for allergies.  Current Outpatient Medications (Analgesics):    ibuprofen (ADVIL) 200 MG tablet, Take 200 mg by mouth as needed.   Current Outpatient Medications (Other):    Calcium Carb-Cholecalciferol (CALTRATE 600+D3) 600-800 MG-UNIT TABS, Take 1 tablet by mouth daily.   FLUAD QUADRIVALENT 0.5 ML injection, Inject 0.5 mLs into the muscle once.   gabapentin (NEURONTIN) 100 MG capsule, Take 2 capsules (200 mg total) by mouth at bedtime.   SPIKEVAX injection, Inject 0.5 mLs into the muscle once.   Reviewed prior external information  including notes and imaging from  primary care provider As well as notes that were available from care everywhere and other healthcare systems.  Past medical history, social, surgical and family history all reviewed in electronic medical record.  No pertanent information unless stated regarding to the chief complaint.   Review of Systems:  No headache, visual changes, nausea, vomiting, diarrhea, constipation, dizziness, abdominal pain, skin rash, fevers, chills, night sweats, weight loss, swollen lymph nodes, body aches, joint swelling, chest pain, shortness of breath, mood changes. POSITIVE muscle aches  Objective  Blood pressure 132/80, pulse 73, height 6' (1.829 m), weight 170 lb (77.1 kg), SpO2 98 %.   General: No apparent distress alert and oriented x3 mood and affect normal, dressed appropriately.  HEENT: Pupils equal, extraocular movements intact  Respiratory: Patient's speak in full sentences and does not appear short of breath  Cardiovascular: No lower extremity edema, non tender, no erythema  Right shoulder exam shows the patient does have positive impingement still noted.  Does have weakness noted of the supraspinatus with 4+ out of 5 strength compared to the contralateral side.  Still has a positive crossover test noted.  Limited muscular skeletal ultrasound was performed and interpreted by Hulan Saas, M  Limited ultrasound of patient's right shoulder still stressful intersubstance tearing noted of the supraspinatus noted near the footplate.  It does seem to be somewhat in the critical zone.  Patient does show some improvement in some of the other intrasubstance tearing noted of the subscapularis. Labral inflammation posterior  Impression: Very slight improvement noted.    Impression and Recommendations:    The above documentation has been reviewed and is accurate and complete Lyndal Pulley, DO

## 2022-08-11 ENCOUNTER — Other Ambulatory Visit: Payer: Self-pay

## 2022-08-11 ENCOUNTER — Ambulatory Visit: Payer: Medicare Other | Admitting: Family Medicine

## 2022-08-11 ENCOUNTER — Encounter: Payer: Self-pay | Admitting: Family Medicine

## 2022-08-11 VITALS — BP 132/80 | HR 73 | Ht 72.0 in | Wt 170.0 lb

## 2022-08-11 DIAGNOSIS — M75121 Complete rotator cuff tear or rupture of right shoulder, not specified as traumatic: Secondary | ICD-10-CM

## 2022-08-11 DIAGNOSIS — M19011 Primary osteoarthritis, right shoulder: Secondary | ICD-10-CM | POA: Diagnosis not present

## 2022-08-11 NOTE — Patient Instructions (Addendum)
PT referral [facility] Let's try before we get more aggressive interventions See you again in 6-8 weeks

## 2022-08-11 NOTE — Assessment & Plan Note (Signed)
Patient does have some very mild improvement of the superficial aspects of the tearing noted on the MRI previously but unfortunately the articular side does not have as much healing as we would anticipate.  Labral tear does still seem to be there as well with continued hypoechoic changes in the bicep tendon and some posterior glenohumeral joint.  Continue to monitor and try to formal physical therapy but if no improvement possible surgical intervention is necessary.

## 2022-08-11 NOTE — Assessment & Plan Note (Signed)
Unfortunately continues to have the acromioclavicular arthritis as well as interstitial rotator cuff tear.  Patient did try the PRP with no significant improvement but feels that he would like to try 1 more round of formal physical therapy to see if this would be beneficial before he would consider surgical intervention.  Follow-up with me again in 6 to 8 weeks

## 2022-08-12 ENCOUNTER — Encounter: Payer: Self-pay | Admitting: Family Medicine

## 2022-08-19 DIAGNOSIS — M75121 Complete rotator cuff tear or rupture of right shoulder, not specified as traumatic: Secondary | ICD-10-CM | POA: Diagnosis not present

## 2022-08-23 DIAGNOSIS — M75121 Complete rotator cuff tear or rupture of right shoulder, not specified as traumatic: Secondary | ICD-10-CM | POA: Diagnosis not present

## 2022-08-25 DIAGNOSIS — M75121 Complete rotator cuff tear or rupture of right shoulder, not specified as traumatic: Secondary | ICD-10-CM | POA: Diagnosis not present

## 2022-08-30 DIAGNOSIS — M75121 Complete rotator cuff tear or rupture of right shoulder, not specified as traumatic: Secondary | ICD-10-CM | POA: Diagnosis not present

## 2022-09-01 DIAGNOSIS — M75121 Complete rotator cuff tear or rupture of right shoulder, not specified as traumatic: Secondary | ICD-10-CM | POA: Diagnosis not present

## 2022-09-06 DIAGNOSIS — M75121 Complete rotator cuff tear or rupture of right shoulder, not specified as traumatic: Secondary | ICD-10-CM | POA: Diagnosis not present

## 2022-09-08 DIAGNOSIS — M75121 Complete rotator cuff tear or rupture of right shoulder, not specified as traumatic: Secondary | ICD-10-CM | POA: Diagnosis not present

## 2022-09-13 DIAGNOSIS — M75121 Complete rotator cuff tear or rupture of right shoulder, not specified as traumatic: Secondary | ICD-10-CM | POA: Diagnosis not present

## 2022-09-15 DIAGNOSIS — M75121 Complete rotator cuff tear or rupture of right shoulder, not specified as traumatic: Secondary | ICD-10-CM | POA: Diagnosis not present

## 2022-09-22 DIAGNOSIS — M75121 Complete rotator cuff tear or rupture of right shoulder, not specified as traumatic: Secondary | ICD-10-CM | POA: Diagnosis not present

## 2022-09-27 DIAGNOSIS — M75121 Complete rotator cuff tear or rupture of right shoulder, not specified as traumatic: Secondary | ICD-10-CM | POA: Diagnosis not present

## 2022-09-28 NOTE — Progress Notes (Signed)
[facility] [address] [place] [address] Phone: [phone] Subjective:   [name], am serving as a scribe for Dr. Antoine Primas.  I'm seeing this patient by the request  of:  [name], NP  CC: shoulder pain   [id number]  08/11/2022 Patient does have some very mild improvement of the superficial aspects of the tearing noted on the MRI previously but unfortunately the articular side does not have as much healing as we would anticipate. Labral tear does still seem to be there as well with continued hypoechoic changes in the bicep tendon and some posterior glenohumeral joint. Continue to monitor and try to formal physical therapy but if no improvement possible surgical intervention is necessary.   Updated 09/29/2022 Carlos Hickman is a 67 y.o. male coming in with complaint of shoulder pain. PT has helped with strength and flexibility, but not pain. Not taking anything at home.      Past Medical History:  Diagnosis Date   Allergy    Anxiety    BPH (benign prostatic hyperplasia)    Depression    Diverticulosis    Heart murmur    Hyperlipidemia    Mitral valve prolapse    Past Surgical History:  Procedure Laterality Date   COLONOSCOPY  04/29/2021   and 2019 with Yancey Flemings at [facility]   WISDOM TOOTH EXTRACTION     Social History   Socioeconomic History   Marital status: Married    Spouse name: Not on file   Number of children: Not on file   Years of education: Not on file   Highest education level: Not on file  Occupational History   Not on file  Tobacco Use   Smoking status: Former    Types: Cigarettes    Quit date: 11/10/2014    Years since quitting: 7.8   Smokeless tobacco: Never  Vaping Use   Vaping Use: Never used  Substance and Sexual Activity   Alcohol use: Yes    Alcohol/week: 5.0 - 7.0 standard drinks of alcohol    Types: 5 - 7 Standard drinks or equivalent per week    Comment: 3 or 4 times a week    Drug use: No   Sexual activity: Not on file  Other Topics Concern   Not on file  Social History Narrative   Diet: Regular      Do you drink/ eat things with caffeine? Yes      Marital status:   Maried                            What year were you married ? 1983      Do you live in a house, apartment,assistred living, condo, trailer, etc.)? House      Is it one or more stories?  No      How many persons live in your home ?  1      Do you have any pets in your home ?(please list) No      Current or past profession: Production designer, theatre/television/film at [employer]      Do you exercise? Occasionally                               Type & how often: Walk,Run Bike      Do you have a living will? No  Do you have a DNR form?   No                    If not, do you want to discuss one? Will do so      Do you have signed POA?HPOA forms? No                If so, please bring to your        appointment         Social Determinants of Health   Financial Resource Strain: Not on file  Food Insecurity: Not on file  Transportation Needs: Not on file  Physical Activity: Not on file  Stress: Not on file  Social Connections: Not on file   No Known Allergies Family History  Problem Relation Age of Onset   Diabetes Mother    Dementia Mother    Stroke Father    Colon cancer Paternal Aunt    Esophageal cancer Neg Hx    Liver cancer Neg Hx    Pancreatic cancer Neg Hx    Rectal cancer Neg Hx    Stomach cancer Neg Hx    Colon polyps Neg Hx       Current Outpatient Medications (Respiratory):    cetirizine (ZYRTEC) 10 MG tablet, Take 10 mg by mouth as needed for allergies.  Current Outpatient Medications (Analgesics):    ibuprofen (ADVIL) 200 MG tablet, Take 200 mg by mouth as needed.   Current Outpatient Medications (Other):    Calcium Carb-Cholecalciferol (CALTRATE 600+D3) 600-800 MG-UNIT TABS, Take 1 tablet by mouth daily.   FLUAD QUADRIVALENT 0.5 ML injection, Inject 0.5 mLs into the muscle once.    gabapentin (NEURONTIN) 100 MG capsule, Take 2 capsules (200 mg total) by mouth at bedtime.   SPIKEVAX injection, Inject 0.5 mLs into the muscle once.   Reviewed prior external information including notes and imaging from  primary care provider As well as notes that were available from care everywhere and other healthcare systems.  Past medical history, social, surgical and family history all reviewed in electronic medical record.  No pertanent information unless stated regarding to the chief complaint.   Review of Systems:  No headache, visual changes, nausea, vomiting, diarrhea, constipation, dizziness, abdominal pain, skin rash, fevers, chills, night sweats, weight loss, swollen lymph nodes, body aches, joint swelling, chest pain, shortness of breath, mood changes. POSITIVE muscle aches  Objective  Blood pressure 126/80, pulse 76, height 6' (1.829 m), weight 170 lb (77.1 kg), SpO2 95 %.   General: No apparent distress alert and oriented x3 mood and affect normal, dressed appropriately.  HEENT: Pupils equal, extraocular movements intact  Respiratory: Patient's speak in full sentences and does not appear short of breath  Cardiovascular: No lower extremity edema, non tender, no erythema  Right shoulder does have significant improvement in strength that is symmetric to contralateral side.  Positive impingement noted minorly with the crossover as well as O'Brien's test.  Procedure: Real-time Ultrasound Guided Injection of right glenohumeral joint Device: GE Logiq Q7  Ultrasound guided injection is preferred based studies that show increased duration, increased effect, greater accuracy, decreased procedural pain, increased response rate with ultrasound guided versus blind injection.  Verbal informed consent obtained.  Time-out conducted.  Noted no overlying erythema, induration, or other signs of local infection.  Skin prepped in a sterile fashion.  Local anesthesia: Topical Ethyl chloride.   With sterile technique and under real time ultrasound guidance:  Joint visualized.  23g  1  inch needle inserted posterior approach. Pictures taken for needle placement. Patient did have injection of 2 cc of 0.5% Marcaine, and 1.0 cc of Kenalog 40 mg/dL. Completed without difficulty  Pain immediately resolved suggesting accurate placement of the medication.  Advised to call if fevers/chills, erythema, induration, drainage, or persistent bleeding.  Impression: Technically successful ultrasound guided injection.  Procedure: Real-time Ultrasound Guided Injection of the acromioclavicular joint Device: GE Logiq Q7 Ultrasound guided injection is preferred based studies that show increased duration, increased effect, greater accuracy, decreased procedural pain, increased response rate, and decreased cost with ultrasound guided versus blind injection.  Verbal informed consent obtained.  Time-out conducted.  Noted no overlying erythema, induration, or other signs of local infection.  Skin prepped in a sterile fashion.  Local anesthesia: Topical Ethyl chloride.  With sterile technique and under real time ultrasound guidance: With a 25-gauge half inch needle injected with 0.5 cc of 0.5% Marcaine and 0.5 cc of Kenalog 40 mg/mL Completed without difficulty  Pain immediately resolved suggesting accurate placement of the medication.  Advised to call if fevers/chills, erythema, induration, drainage, or persistent bleeding.  Impression: Technically successful ultrasound guided injection.    Impression and Recommendations:    The above documentation has been reviewed and is accurate and complete Judi Saa, DO

## 2022-09-29 ENCOUNTER — Encounter: Payer: Self-pay | Admitting: Family Medicine

## 2022-09-29 ENCOUNTER — Other Ambulatory Visit: Payer: Self-pay

## 2022-09-29 ENCOUNTER — Ambulatory Visit: Payer: Medicare Other | Admitting: Family Medicine

## 2022-09-29 VITALS — BP 126/80 | HR 76 | Ht 72.0 in | Wt 170.0 lb

## 2022-09-29 DIAGNOSIS — M19011 Primary osteoarthritis, right shoulder: Secondary | ICD-10-CM | POA: Diagnosis not present

## 2022-09-29 DIAGNOSIS — M75121 Complete rotator cuff tear or rupture of right shoulder, not specified as traumatic: Secondary | ICD-10-CM | POA: Diagnosis not present

## 2022-09-29 NOTE — Assessment & Plan Note (Signed)
Patient given injection and tolerated the procedure well, discussed icing regimen and home exercises, discussed which activities to do and which ones to avoid.  Hopeful that this will make some improvement as well.  Follow-up with me again in 2 to 3 months

## 2022-09-29 NOTE — Patient Instructions (Signed)
Injection in shoulder today See you again in 2 months

## 2022-09-29 NOTE — Assessment & Plan Note (Signed)
It does appear with PRP well greater than 3 months out it does seem that there is significant healing.  Encourage patient to continue to start increasing activity.  I did have a subacromial bursitis that seem to be more reactive likely to be increasing in activity.  At this point would like patient to try to increase activity now and see how patient responds.  Will be going to [place] for the summer.  Follow-up with me again 2 months

## 2023-04-24 ENCOUNTER — Encounter: Payer: Self-pay | Admitting: Nurse Practitioner

## 2023-04-24 ENCOUNTER — Ambulatory Visit (INDEPENDENT_AMBULATORY_CARE_PROVIDER_SITE_OTHER): Payer: Medicare Other | Admitting: Nurse Practitioner

## 2023-04-24 DIAGNOSIS — Z0000 Encounter for general adult medical examination without abnormal findings: Secondary | ICD-10-CM | POA: Diagnosis not present

## 2023-04-24 NOTE — Progress Notes (Signed)
   This service is provided via telemedicine  No vital signs collected/recorded due to the encounter was a telemedicine visit.   Location of patient (ex: home, work):  Home  Patient consents to a telephone visit: Yes  Location of the provider (ex: office, home):  [facility], Office   Name of any referring provider:  N/A  Names of all persons participating in the telemedicine service and their role in the encounter:  S.Chrae B/CMA, Abbey Chatters, NP, and Patient   Time spent on call:  7 min with medical assistant

## 2023-04-24 NOTE — Progress Notes (Signed)
Subjective:   Carlos Hickman is a 67 y.o. male who presents for Medicare Annual/Subsequent preventive examination.  Visit Complete: Virtual I connected with  Sherolyn Buba on 04/24/23 by a video and audio enabled telemedicine application and verified that I am speaking with the correct person using two identifiers.  Patient Location: Home  Provider Location: Office/Clinic  I discussed the limitations of evaluation and management by telemedicine. The patient expressed understanding and agreed to proceed.  Vital Signs: Because this visit was a virtual/telehealth visit, some criteria may be missing or patient reported. Any vitals not documented were not able to be obtained and vitals that have been documented are patient reported.    Cardiac Risk Factors include: advanced age (>76men, >14 women);family history of premature cardiovascular disease;hypertension;dyslipidemia     Objective:    There were no vitals filed for this visit. There is no height or weight on file to calculate BMI.     04/24/2023   10:17 AM 07/18/2022    8:20 AM 04/07/2022   11:08 AM 07/12/2021    8:05 AM 04/05/2021    8:18 AM 03/22/2021    9:07 AM 07/02/2020    7:42 AM  Advanced Directives  Does Patient Have a Medical Advance Directive? Yes Yes Yes Yes Yes Yes Yes  Type of Estate agent of [place];Living will Healthcare Power of [place];Living will;Out of facility DNR (pink MOST or yellow form) Healthcare Power of [place];Living will Living will;Healthcare Power of [employer] Power of [place];Living will Healthcare Power of [place];Living will Living will  Does patient want to make changes to medical advance directive? No - Patient declined No - Patient declined No - Patient declined No - Patient declined No - Patient declined No - Patient declined No - Patient declined  Copy of Healthcare Power of Attorney in Chart? Yes - validated most recent copy scanned in chart (See row  information)  Yes - validated most recent copy scanned in chart (See row information) Yes - validated most recent copy scanned in chart (See row information) Yes - validated most recent copy scanned in chart (See row information) Yes - validated most recent copy scanned in chart (See row information)     Current Medications (verified) Outpatient Encounter Medications as of 04/24/2023  Medication Sig   Calcium Carb-Cholecalciferol (CALTRATE 600+D3) 600-800 MG-UNIT TABS Take 1 tablet by mouth daily.   cetirizine (ZYRTEC) 10 MG tablet Take 10 mg by mouth as needed for allergies.   ibuprofen (ADVIL) 200 MG tablet Take 200 mg by mouth as needed.   [DISCONTINUED] FLUAD QUADRIVALENT 0.5 ML injection Inject 0.5 mLs into the muscle once. (Patient not taking: Reported on 04/24/2023)   [DISCONTINUED] gabapentin (NEURONTIN) 100 MG capsule Take 2 capsules (200 mg total) by mouth at bedtime.   [DISCONTINUED] SPIKEVAX injection Inject 0.5 mLs into the muscle once. (Patient not taking: Reported on 04/24/2023)   No facility-administered encounter medications on file as of 04/24/2023.    Allergies (verified) Patient has no known allergies.   History: Past Medical History:  Diagnosis Date   Allergy    Anxiety    BPH (benign prostatic hyperplasia)    Depression    Diverticulosis    Heart murmur    Hyperlipidemia    Mitral valve prolapse    Past Surgical History:  Procedure Laterality Date   COLONOSCOPY  04/29/2021   and 2019 with Yancey Flemings at [facility]   WISDOM TOOTH EXTRACTION     Family History  Problem  Relation Age of Onset   Diabetes Mother    Dementia Mother    Stroke Father    Colon cancer Paternal Aunt    Esophageal cancer Neg Hx    Liver cancer Neg Hx    Pancreatic cancer Neg Hx    Rectal cancer Neg Hx    Stomach cancer Neg Hx    Colon polyps Neg Hx    Social History   Socioeconomic History   Marital status: Married    Spouse name: Not on file   Number of children: Not on file    Years of education: Not on file   Highest education level: Not on file  Occupational History   Not on file  Tobacco Use   Smoking status: Former    Current packs/day: 0.00    Types: Cigarettes    Quit date: 11/10/2014    Years since quitting: 8.4   Smokeless tobacco: Never   Tobacco comments:    About age 4 patient quit   Vaping Use   Vaping status: Never Used  Substance and Sexual Activity   Alcohol use: Yes    Alcohol/week: 5.0 - 7.0 standard drinks of alcohol    Types: 5 - 7 Standard drinks or equivalent per week   Drug use: No   Sexual activity: Not on file  Other Topics Concern   Not on file  Social History Narrative   Diet: Regular      Do you drink/ eat things with caffeine? Yes      Marital status:   Maried                            What year were you married ? 1983      Do you live in a house, apartment,assistred living, condo, trailer, etc.)? House      Is it one or more stories?  No      How many persons live in your home ?  1      Do you have any pets in your home ?(please list) No      Current or past profession: Production designer, theatre/television/film at [employer]      Do you exercise? Occasionally                               Type & how often: Walk,Run Bike      Do you have a living will? No      Do you have a DNR form?   No                    If not, do you want to discuss one? Will do so      Do you have signed POA?HPOA forms? No                If so, please bring to your        appointment         Social Determinants of Health   Financial Resource Strain: Not on file  Food Insecurity: Not on file  Transportation Needs: Not on file  Physical Activity: Not on file  Stress: Not on file  Social Connections: Not on file    Tobacco Counseling Counseling given: Not Answered Tobacco comments: About age 56 patient quit    Clinical Intake:  Pre-visit preparation completed: Yes  Pain : No/denies pain  BMI - recorded: 22.5 Nutritional Status: BMI of 19-24   Normal  How often do you need to have someone help you when you read instructions, pamphlets, or other written materials from your doctor or pharmacy?: 1 - Never         Activities of Daily Living    04/24/2023   12:58 PM 04/20/2023   11:37 AM  In your present state of health, do you have any difficulty performing the following activities:  Hearing? 0 0  Vision? 0 0  Difficulty concentrating or making decisions? 0 0  Walking or climbing stairs? 0 0  Dressing or bathing? 0 0  Doing errands, shopping? 0 0  Preparing Food and eating ? N N  Using the Toilet? N N  In the past six months, have you accidently leaked urine? N N  Do you have problems with loss of bowel control? N N  Managing your Medications? N N  Managing your Finances? N N  Housekeeping or managing your Housekeeping? N N    Patient Care Team: [name], NP as PCP - General (Geriatric Medicine)  Indicate any recent Medical Services you may have received from other than [facility] providers in the past year (date may be approximate).     Assessment:   This is a routine wellness examination for Jancarlos.  Hearing/Vision screen Hearing Screening - Comments:: No hearing issues  Vision Screening - Comments:: Last eye exam less than 12 months ago with [facility]    Goals Addressed   None    Depression Screen    04/24/2023   10:17 AM 07/18/2022    8:20 AM 04/07/2022   11:06 AM 07/12/2021    8:04 AM 04/05/2021    8:57 AM 07/02/2020    7:42 AM 07/01/2019    9:00 AM  PHQ 2/9 Scores  PHQ - 2 Score 0 0 0 0 0 0 0    Fall Risk    04/24/2023   10:17 AM 04/20/2023   11:37 AM 07/18/2022    8:19 AM 04/07/2022   11:07 AM 07/12/2021    8:04 AM  Fall Risk   Falls in the past year? 0 0 0 0 0  Number falls in past yr: 0 0 0 0 0  Injury with Fall? 0 0 0 0 0  Risk for fall due to : No Fall Risks  No Fall Risks No Fall Risks No Fall Risks  Follow up Falls evaluation completed  Falls evaluation completed Falls  evaluation completed Falls evaluation completed    MEDICARE RISK AT HOME: Medicare Risk at Home Any stairs in or around the home?: No If so, are there any without handrails?: (P) No Home free of loose throw rugs in walkways, pet beds, electrical cords, etc?: Yes Adequate lighting in your home to reduce risk of falls?: Yes Life alert?: No Use of a cane, walker or w/c?: No Grab bars in the bathroom?: Yes Shower chair or bench in shower?: No Elevated toilet seat or a handicapped toilet?: Yes  TIMED UP AND GO:  Was the test performed?  No    Cognitive Function:    04/05/2021    9:01 AM  MMSE - Mini Mental State Exam  Orientation to time 5  Orientation to Place 5  Registration 3  Attention/ Calculation 5  Recall 3  Language- name 2 objects 2  Language- repeat 1  Language- follow 3 step command 3  Language- read & follow direction 1  Write a sentence  1  Copy design 1  Total score 30        04/24/2023   12:43 PM 04/07/2022   11:08 AM  6CIT Screen  What Year? 0 points 0 points  What month? 0 points 0 points  What time? 0 points 0 points  Count back from 20 0 points 0 points  Months in reverse 0 points 0 points  Repeat phrase 0 points 0 points  Total Score 0 points 0 points    Immunizations Immunization History  Administered Date(s) Administered   Fluad Quad(high Dose 65+) 03/22/2021   Influenza, High Dose Seasonal PF 02/21/2023   Influenza, Quadrivalent, Recombinant, Inj, Pf 03/18/2018   Influenza,inj,Quad PF,6+ Mos 03/24/2014, 02/26/2016, 03/18/2018, 04/09/2019   Influenza-Unspecified 03/20/2013, 03/24/2015, 03/25/2017, 04/12/2017, 03/13/2020, 02/09/2022   Moderna Covid-19 Fall Seasonal Vaccine 45yrs & older 02/21/2023   PFIZER Comirnaty(Gray Top)Covid-19 Tri-Sucrose Vaccine 07/15/2019, 08/05/2019, 04/12/2020   PFIZER(Purple Top)SARS-COV-2 Vaccination 09/19/2020, 02/15/2021   PNEUMOCOCCAL CONJUGATE-20 04/05/2021   Pfizer Covid-19 Vaccine Bivalent Booster 84yrs  & up 02/20/2021, 09/13/2021, 02/09/2022   Tdap 03/24/2015   Zoster Recombinant(Shingrix) 01/22/2023, 02/20/2023    TDAP status: Up to date  Flu Vaccine status: Up to date  Pneumococcal vaccine status: Up to date  Covid-19 vaccine status: Information provided on how to obtain vaccines.   Qualifies for Shingles Vaccine? Yes   Zostavax completed No   Shingrix Completed?: Yes  Screening Tests Health Maintenance  Topic Date Due   COVID-19 Vaccine (10 - 2023-24 season) 06/24/2023   Medicare Annual Wellness (AWV)  04/23/2024   DTaP/Tdap/Td (2 - Td or Tdap) 03/23/2025   Colonoscopy  04/29/2026   Pneumonia Vaccine 66+ Years old  Completed   INFLUENZA VACCINE  Completed   Hepatitis C Screening  Completed   Zoster Vaccines- Shingrix  Completed   HPV VACCINES  Aged Out    Health Maintenance  There are no preventive care reminders to display for this patient.   Colorectal cancer screening: Type of screening: Colonoscopy. Completed 04/2021. Repeat every 56 years  Lung Cancer Screening: (Low Dose CT Chest recommended if Age 71-80 years, 20 pack-year currently smoking OR have quit w/in 15years.) does not qualify.   Lung Cancer Screening Referral: na  Additional Screening:  Hepatitis C Screening: does qualify; Completed   Vision Screening: Recommended annual ophthalmology exams for early detection of glaucoma and other disorders of the eye. Is the patient up to date with their annual eye exam?  Yes  Who is the provider or what is the name of the office in which the patient attends annual eye exams? [facility]  If pt is not established with a provider, would they like to be referred to a provider to establish care? No .   Dental Screening: Recommended annual dental exams for proper oral hygiene  Community Resource Referral / Chronic Care Management: CRR required this visit?  No   CCM required this visit?  No     Plan:     I have personally reviewed and noted the  following in the patient's chart:   Medical and social history Use of alcohol, tobacco or illicit drugs  Current medications and supplements including opioid prescriptions. Patient is not currently taking opioid prescriptions. Functional ability and status Nutritional status Physical activity Advanced directives List of other physicians Hospitalizations, surgeries, and ER visits in previous 12 months Vitals Screenings to include cognitive, depression, and falls Referrals and appointments  In addition, I have reviewed and discussed with patient certain preventive protocols, quality metrics,  and best practice recommendations. A written personalized care plan for preventive services as well as general preventive health recommendations were provided to patient.     [name], NP   04/24/2023   After Visit Summary: (MyChart) Due to this being a telephonic visit, the after visit summary with patients personalized plan was offered to patient via MyChart

## 2023-04-24 NOTE — Patient Instructions (Signed)
  Carlos Hickman , Thank you for taking time to come for your Medicare Wellness Visit. I appreciate your ongoing commitment to your health goals. Please review the following plan we discussed and let me know if I can assist you in the future.   These are the goals we discussed:  Goals      Patient Stated     To maintain activity level and weight         This is a list of the screening recommended for you and due dates:  Health Maintenance  Topic Date Due   COVID-19 Vaccine (10 - 2023-24 season) 06/24/2023   Medicare Annual Wellness Visit  04/23/2024   DTaP/Tdap/Td vaccine (2 - Td or Tdap) 03/23/2025   Colon Cancer Screening  04/29/2026   Pneumonia Vaccine  Completed   Flu Shot  Completed   Hepatitis C Screening  Completed   Zoster (Shingles) Vaccine  Completed   HPV Vaccine  Aged Out

## 2023-06-07 ENCOUNTER — Ambulatory Visit (INDEPENDENT_AMBULATORY_CARE_PROVIDER_SITE_OTHER): Payer: Medicare Other | Admitting: Sports Medicine

## 2023-06-07 ENCOUNTER — Ambulatory Visit
Admission: RE | Admit: 2023-06-07 | Discharge: 2023-06-07 | Disposition: A | Discharge status: Home / Self Care | Payer: Medicare Other | Source: Ambulatory Visit | Attending: Sports Medicine | Admitting: Sports Medicine

## 2023-06-07 ENCOUNTER — Encounter: Payer: Self-pay | Admitting: Sports Medicine

## 2023-06-07 VITALS — BP 127/88 | HR 58 | Temp 96.7°F | Resp 18 | Ht 72.0 in | Wt 177.2 lb

## 2023-06-07 DIAGNOSIS — R0781 Pleurodynia: Secondary | ICD-10-CM

## 2023-06-07 MED ORDER — LIDOCAINE 4 % EX PTCH: 1.0000 | MEDICATED_PATCH | CUTANEOUS | 0 refills | Status: DC

## 2023-06-07 MED ORDER — MELOXICAM 7.5 MG PO TABS: 7.5000 mg | ORAL_TABLET | Freq: Every day | ORAL | 0 refills | Status: DC

## 2023-06-07 NOTE — Progress Notes (Signed)
 Careteam: Patient Care Team: Verma Gobble, NP as PCP - General (Geriatric Medicine)  PLACE OF SERVICE:  [facility]  Advanced Directive information Does Patient Have a Medical Advance Directive?: Yes, Type of Advance Directive: Healthcare Power of [place];Living will  No Known Allergies  Chief Complaint  Patient presents with   Acute Visit     fell on ice pain on right side think he broke a rib    Discussed the use of AI scribe software for clinical note transcription with the patient, who gave verbal consent to proceed.  History of Present Illness   The patient, presented to the clinic following a fall on ice. The fall occurred on the right side of the body, resulting in discomfort in the right lower rib area. The pain, initially mild, significantly increased during deep breathing exercises. The patient rates the pain as a 6-7/10 at rest, escalating to a 12/10 during coughing, sneezing, or any activity requiring diaphragmatic pressure. The patient has been managing the pain with over-the-counter ibuprofen (200mg , 2 tablets daily). The patient denies any breathing difficulties, dizziness, lightheadedness, muscle spasms, abdominal pain, or head injury from the fall. However, deep breaths exacerbate the pain. The patient also reported a hyperextended finger from the fall, but denies any significant pain or movement restriction in the finger.          Review of Systems:  Review of Systems  Constitutional:  Negative for chills and fever.  HENT:  Negative for congestion and sore throat.   Eyes:  Negative for double vision.  Respiratory:  Negative for cough, sputum production and shortness of breath.   Cardiovascular:  Negative for chest pain, palpitations and leg swelling.  Gastrointestinal:  Negative for abdominal pain, heartburn and nausea.  Genitourinary:  Negative for dysuria, frequency and hematuria.  Musculoskeletal:  Positive for myalgias. Negative for falls.   Neurological:  Negative for dizziness, sensory change and focal weakness.   Negative unless indicated in HPI.   Past Medical History:  Diagnosis Date   Allergy    Anxiety    BPH (benign prostatic hyperplasia)    Depression    Diverticulosis    Heart murmur    Hyperlipidemia    Mitral valve prolapse    Past Surgical History:  Procedure Laterality Date   COLONOSCOPY  04/29/2021   and 2019 with Legrand Puma at [facility]   WISDOM TOOTH EXTRACTION     Social History:   reports that he quit smoking about 8 years ago. His smoking use included cigarettes. He has never used smokeless tobacco. He reports current alcohol use of about 5.0 - 7.0 standard drinks of alcohol per week. He reports that he does not use drugs.  Family History  Problem Relation Age of Onset   Diabetes Mother    Dementia Mother    Stroke Father    Colon cancer Paternal Aunt    Esophageal cancer Neg Hx    Liver cancer Neg Hx    Pancreatic cancer Neg Hx    Rectal cancer Neg Hx    Stomach cancer Neg Hx    Colon polyps Neg Hx     Medications: Patient's Medications  New Prescriptions   No medications on file  Previous Medications   CALCIUM CARB-CHOLECALCIFEROL (CALTRATE 600+D3) 600-800 MG-UNIT TABS    Take 1 tablet by mouth daily.   CETIRIZINE (ZYRTEC) 10 MG TABLET    Take 10 mg by mouth as needed for allergies.   IBUPROFEN (ADVIL) 200 MG TABLET  Take 200 mg by mouth as needed.  Modified Medications   No medications on file  Discontinued Medications   No medications on file    Physical Exam: Vitals:   06/07/23 1017  BP: 127/88  Pulse: (!) 58  Resp: 18  Temp: (!) 96.7 F (35.9 C)  SpO2: 98%  Weight: 177 lb 3.2 oz (80.4 kg)  Height: 6' (1.829 m)   Body mass index is 24.03 kg/m. BP Readings from Last 3 Encounters:  06/07/23 127/88  09/29/22 126/80  08/11/22 132/80   Wt Readings from Last 3 Encounters:  06/07/23 177 lb 3.2 oz (80.4 kg)  09/29/22 170 lb (77.1 kg)  08/11/22 170 lb (77.1 kg)     Physical Exam Constitutional:      Appearance: Normal appearance.  HENT:     Head: Normocephalic and atraumatic.  Cardiovascular:     Rate and Rhythm: Normal rate and regular rhythm.     Pulses: Normal pulses.     Heart sounds: Normal heart sounds.  Pulmonary:     Effort: No respiratory distress.     Breath sounds: No stridor. No wheezing or rales.  Abdominal:     General: Bowel sounds are normal. There is no distension.     Palpations: Abdomen is soft.     Tenderness: There is no abdominal tenderness. There is no right CVA tenderness or guarding.  Musculoskeletal:        General: No swelling.     Comments: Tenderness on lower Rt  rib cage  ant, laterally  Neurological:     Mental Status: He is alert. Mental status is at baseline.     Sensory: No sensory deficit.     Motor: No weakness.     Labs reviewed: Basic Metabolic Panel: Recent Labs    07/14/22 0819  NA 140  K 4.3  CL 104  CO2 29  GLUCOSE 87  BUN 15  CREATININE 1.03  CALCIUM 9.5   Liver Function Tests: Recent Labs    07/14/22 0819  AST 20  ALT 15  BILITOT 0.5  PROT 6.7   No results for input(s): "LIPASE", "AMYLASE" in the last 8760 hours. No results for input(s): "AMMONIA" in the last 8760 hours. CBC: Recent Labs    07/14/22 0819  WBC 4.4  NEUTROABS 2,354  HGB 14.8  HCT 42.7  MCV 92.6  PLT 195   Lipid Panel: Recent Labs    07/14/22 0819  CHOL 193  HDL 91  LDLCALC 90  TRIG 40  CHOLHDL 2.1   TSH: No results for input(s): "TSH" in the last 8760 hours. A1C: Lab Results  Component Value Date   HGBA1C 5.8 (H) 07/14/2022     Assessment/Plan Right Rib Pain Fall on ice with subsequent increase in pain after deep breathing exercises. Pain is constant with exacerbation during coughing, sneezing, and deep breathing. No associated dyspnea, dizziness, or abdominal pain. Tenderness on palpation -Order rib x-ray to rule out fracture. -Prescribe Meloxicam  once daily with food for pain  management. -Advise use of Lidocaine  patch on the affected area for additional pain relief. -Encourage deep breathing exercises to prevent atelectasis and potential infection. -Follow-up as needed based on x-ray results and pain management.  No follow-ups on file.:

## 2023-06-07 NOTE — Patient Instructions (Signed)
-  RIGHT RIB PAIN: Right rib pain can occur due to injury or trauma, such as a fall. We will order a rib x-ray to check for any fractures. For pain management, you are prescribed Meloxicam  to be taken once daily with food, and you can also use a Lidocaine  patch on the affected area. Additionally, it is important to do deep breathing exercises to prevent lung complications like infection.  INSTRUCTIONS:  Please follow up as needed based on your x-ray results and pain management. If your pain worsens or you experience any new symptoms, contact the clinic immediately.

## 2023-07-14 ENCOUNTER — Other Ambulatory Visit: Payer: Medicare Other

## 2023-07-14 DIAGNOSIS — Z87312 Personal history of (healed) stress fracture: Secondary | ICD-10-CM

## 2023-07-14 DIAGNOSIS — R739 Hyperglycemia, unspecified: Secondary | ICD-10-CM | POA: Diagnosis not present

## 2023-07-14 DIAGNOSIS — E785 Hyperlipidemia, unspecified: Secondary | ICD-10-CM | POA: Diagnosis not present

## 2023-07-15 LAB — HEMOGLOBIN A1C
Hgb A1c MFr Bld: 6 %{Hb} — ABNORMAL HIGH (ref <5.7)
Mean Plasma Glucose: 126 mg/dL
eAG (mmol/L): 7 mmol/L

## 2023-07-15 LAB — CBC WITH DIFFERENTIAL/PLATELET
Absolute Lymphocytes: 1833 {cells}/uL (ref 850–3900)
Absolute Monocytes: 524 {cells}/uL (ref 200–950)
Basophils Absolute: 39 {cells}/uL (ref 0–200)
Basophils Relative: 0.8 %
Eosinophils Absolute: 93 {cells}/uL (ref 15–500)
Eosinophils Relative: 1.9 %
HCT: 44.4 % (ref 38.5–50.0)
Hemoglobin: 15 g/dL (ref 13.2–17.1)
MCH: 31.3 pg (ref 27.0–33.0)
MCHC: 33.8 g/dL (ref 32.0–36.0)
MCV: 92.5 fL (ref 80.0–100.0)
MPV: 12 fL (ref 7.5–12.5)
Monocytes Relative: 10.7 %
Neutro Abs: 2411 {cells}/uL (ref 1500–7800)
Neutrophils Relative %: 49.2 %
Platelets: 234 10*3/uL (ref 140–400)
RBC: 4.8 10*6/uL (ref 4.20–5.80)
RDW: 12.6 % (ref 11.0–15.0)
Total Lymphocyte: 37.4 %
WBC: 4.9 10*3/uL (ref 3.8–10.8)

## 2023-07-15 LAB — COMPLETE METABOLIC PANEL WITH GFR
AG Ratio: 1.9 (calc) (ref 1.0–2.5)
ALT: 21 U/L (ref 9–46)
AST: 22 U/L (ref 10–35)
Albumin: 4.6 g/dL (ref 3.6–5.1)
Alkaline phosphatase (APISO): 73 U/L (ref 35–144)
BUN: 12 mg/dL (ref 7–25)
CO2: 29 mmol/L (ref 20–32)
Calcium: 10.2 mg/dL (ref 8.6–10.3)
Chloride: 102 mmol/L (ref 98–110)
Creat: 1.18 mg/dL (ref 0.70–1.35)
Globulin: 2.4 g/dL (ref 1.9–3.7)
Glucose, Bld: 95 mg/dL (ref 65–99)
Potassium: 5.3 mmol/L (ref 3.5–5.3)
Sodium: 140 mmol/L (ref 135–146)
Total Bilirubin: 0.6 mg/dL (ref 0.2–1.2)
Total Protein: 7 g/dL (ref 6.1–8.1)
eGFR: 68 mL/min/{1.73_m2} (ref >=60)

## 2023-07-15 LAB — LIPID PANEL
Cholesterol: 212 mg/dL — ABNORMAL HIGH (ref <200)
HDL: 105 mg/dL (ref >=40)
LDL Cholesterol (Calc): 95 mg/dL
Non-HDL Cholesterol (Calc): 107 mg/dL (ref <130)
Total CHOL/HDL Ratio: 2 (calc) (ref <5.0)
Triglycerides: 40 mg/dL (ref <150)

## 2023-07-17 ENCOUNTER — Ambulatory Visit (INDEPENDENT_AMBULATORY_CARE_PROVIDER_SITE_OTHER): Payer: Medicare Other | Admitting: Nurse Practitioner

## 2023-07-17 ENCOUNTER — Ambulatory Visit (HOSPITAL_BASED_OUTPATIENT_CLINIC_OR_DEPARTMENT_OTHER)
Admission: RE | Admit: 2023-07-17 | Discharge: 2023-07-17 | Disposition: A | Discharge status: Home / Self Care | Payer: Medicare Other | Source: Ambulatory Visit | Attending: Nurse Practitioner | Admitting: Nurse Practitioner

## 2023-07-17 ENCOUNTER — Encounter: Payer: Self-pay | Admitting: Nurse Practitioner

## 2023-07-17 VITALS — BP 132/78 | HR 61 | Temp 97.8°F | Ht 72.0 in | Wt 178.0 lb

## 2023-07-17 DIAGNOSIS — E785 Hyperlipidemia, unspecified: Secondary | ICD-10-CM | POA: Diagnosis not present

## 2023-07-17 DIAGNOSIS — G4700 Insomnia, unspecified: Secondary | ICD-10-CM | POA: Diagnosis not present

## 2023-07-17 DIAGNOSIS — M778 Other enthesopathies, not elsewhere classified: Secondary | ICD-10-CM | POA: Diagnosis not present

## 2023-07-17 DIAGNOSIS — M79644 Pain in right finger(s): Secondary | ICD-10-CM | POA: Insufficient documentation

## 2023-07-17 DIAGNOSIS — S6991XA Unspecified injury of right wrist, hand and finger(s), initial encounter: Secondary | ICD-10-CM | POA: Diagnosis not present

## 2023-07-17 DIAGNOSIS — R739 Hyperglycemia, unspecified: Secondary | ICD-10-CM

## 2023-07-17 DIAGNOSIS — M19041 Primary osteoarthritis, right hand: Secondary | ICD-10-CM | POA: Diagnosis not present

## 2023-07-17 DIAGNOSIS — M79641 Pain in right hand: Secondary | ICD-10-CM | POA: Diagnosis not present

## 2023-07-17 MED ORDER — ESZOPICLONE 1 MG PO TABS: 1.0000 mg | ORAL_TABLET | Freq: Every evening | ORAL | 0 refills | Status: DC | PRN

## 2023-07-17 NOTE — Progress Notes (Signed)
 Careteam: Patient Care Team: [name], NP as PCP - General (Geriatric Medicine)   PLACE OF SERVICE: [facility]  Advanced Directive information     No Known Allergies   Chief Complaint  Patient presents with   Medical Management of Chronic Issues    Routine visit      HPI: Patient is a 68 y.o. male presents for routine visit.  Reports he is doing well.  Pain in right shoulder is much improved. Has completed several treatments, not requiring pain medications.  Labs reviewed, total cholesterol elevated (212) but HDL 105 and LDL 95. A1C (6.0) elevated. All other lab work within normal limits. Patient states due to his recent fall and rib pain he was not able to exercise as much and did not watch his diet during this time, so he believes these labs are a reflection of that. He is now back to exercising about 1 hour everyday.  Mild right rib pain with coughing and sneezing, otherwise not hurting. Right middle finger is still painful and swollen.  Has a trip coming up to [place] in March and would like to discuss medications for sleep aid due to the time change/jet lag  Review of Systems:  Review of Systems  Constitutional:  Negative for chills, fever and weight loss.  Respiratory:  Negative for cough and shortness of breath.   Cardiovascular:  Negative for chest pain, palpitations and leg swelling.  Gastrointestinal:  Negative for abdominal pain, constipation, diarrhea, nausea and vomiting.  Genitourinary:  Negative for dysuria, frequency and urgency.  Neurological:  Negative for dizziness, weakness and headaches.  Psychiatric/Behavioral:  Negative for depression. The patient is not nervous/anxious and does not have insomnia.     Past Medical History:  Diagnosis Date   Allergy    Anxiety    BPH (benign prostatic hyperplasia)    Depression    Diverticulosis    Heart murmur    Hyperlipidemia    Mitral valve prolapse     Past Surgical History:  Procedure Laterality  Date   COLONOSCOPY  04/29/2021   and 2019 with Yancey Flemings at [facility]   WISDOM TOOTH EXTRACTION       Social History:   reports that he quit smoking about 8 years ago. His smoking use included cigarettes. He has never used smokeless tobacco. He reports current alcohol use of about 5.0 - 7.0 standard drinks of alcohol per week. He reports that he does not use drugs.  Family History  Problem Relation Age of Onset   Diabetes Mother    Dementia Mother    Stroke Father    Colon cancer Paternal Aunt    Esophageal cancer Neg Hx    Liver cancer Neg Hx    Pancreatic cancer Neg Hx    Rectal cancer Neg Hx    Stomach cancer Neg Hx    Colon polyps Neg Hx      Medications:  Patient's Medications  New Prescriptions   ESZOPICLONE (LUNESTA) 1 MG TABS TABLET    Take 1 tablet (1 mg total) by mouth at bedtime as needed for sleep. Take immediately before bedtime  Previous Medications   CALCIUM CARB-CHOLECALCIFEROL (CALTRATE 600+D3) 600-800 MG-UNIT TABS    Take 1 tablet by mouth daily.   CETIRIZINE (ZYRTEC) 10 MG TABLET    Take 10 mg by mouth as needed for allergies.   IBUPROFEN (ADVIL) 200 MG TABLET    Take 200 mg by mouth as needed.   LIDOCAINE 4 %  Place 1 patch onto the skin daily.   MELOXICAM (MOBIC) 7.5 MG TABLET    Take 1 tablet (7.5 mg total) by mouth daily.  Modified Medications   No medications on file  Discontinued Medications   No medications on file     Physical Exam:  Vitals:   07/17/23 0812  BP: 132/78  Pulse: 61  Temp: 97.8 F (36.6 C)  TempSrc: Temporal  SpO2: 99%  Weight: 178 lb (80.7 kg)  Height: 6' (1.829 m)   Body mass index is 24.14 kg/m.  Wt Readings from Last 3 Encounters:  07/17/23 178 lb (80.7 kg)  06/07/23 177 lb 3.2 oz (80.4 kg)  09/29/22 170 lb (77.1 kg)     Physical Exam Constitutional:      Appearance: He is normal weight.  Cardiovascular:     Rate and Rhythm: Normal rate and regular rhythm.     Pulses: Normal pulses.     Heart sounds: Normal heart  sounds.  Pulmonary:     Effort: Pulmonary effort is normal.     Breath sounds: Normal breath sounds.  Abdominal:     General: Bowel sounds are normal.     Palpations: Abdomen is soft.  Musculoskeletal:        General: Swelling (right middle finger) present. Normal range of motion.  Skin:    General: Skin is warm and dry.  Neurological:     General: No focal deficit present.     Mental Status: He is alert and oriented to person, place, and time. Mental status is at baseline.  Psychiatric:        Mood and Affect: Mood normal.        Behavior: Behavior normal.     Labs reviewed: Basic Metabolic Panel:  Recent Labs    07/14/23 0803  NA 140  K 5.3  CL 102  CO2 29  GLUCOSE 95  BUN 12  CREATININE 1.18  CALCIUM 10.2   Liver Function Tests:  Recent Labs    07/14/23 0803  AST 22  ALT 21  BILITOT 0.6  PROT 7.0   No results for input(s): "LIPASE", "AMYLASE" in the last 8760 hours. No results for input(s): "AMMONIA" in the last 8760 hours. CBC:  Recent Labs    07/14/23 0803  WBC 4.9  NEUTROABS 2,411  HGB 15.0  HCT 44.4  MCV 92.5  PLT 234   Lipid Panel:  Recent Labs    07/14/23 0803  CHOL 212*  HDL 105  LDLCALC 95  TRIG 40  CHOLHDL 2.0   TSH: No results for input(s): "TSH" in the last 8760 hours. A1C:  Lab Results  Component Value Date   HGBA1C 6.0 (H) 07/14/2023     Assessment/Plan   1. Hyperglycemia (Primary) -A1c elevated compare to prior but continues in pre-diabetic range, patient would like to continue working on diet and exercise   2. Hyperlipidemia, unspecified hyperlipidemia type -Total cholesterol elevated, HDL 105, LDL 95 -Encouraged dietary modifications and physical activity as tolerated  4. Insomnia, unspecified type -Sleep disturbances during travel - eszopiclone (LUNESTA) 1 MG TABS tablet; Take 1 tablet (1 mg total) by mouth at bedtime as needed for sleep. Take immediately before bedtime  Dispense: 5 tablet; Refill: 0 -Emphasized  importance of avoiding alcohol consumption while taking medication; avoid other sedating medications- ex. benzodiazepines, opioids. -Encouraged proper sleep hygiene  5. Pain of right middle finger -Pain/swelling not improving after fall on 1/15 - DG Hand Complete Right; Future -May use tape to  stabilize finger -Ice and PRN tylenol as needed  Return in about 1 year (around 07/16/2024) for routine follow up, labs prior to visit.  Rollen Sox, Haroldine Laws MSN-FNP Student -I personally was present during the history, physical exam and medical decision-making activities of this service and have verified that the service and findings are accurately documented in the student's note.  Janene Harvey. [facility] [phone]

## 2023-07-19 ENCOUNTER — Encounter: Payer: Self-pay | Admitting: Nurse Practitioner

## 2023-07-20 ENCOUNTER — Encounter: Payer: Self-pay | Admitting: Nurse Practitioner

## 2023-07-27 ENCOUNTER — Encounter: Payer: Self-pay | Admitting: Nurse Practitioner

## 2023-07-28 NOTE — Telephone Encounter (Signed)
Message routed to PCP Eubanks, Jessica K, NP  

## 2023-08-29 DIAGNOSIS — Z1283 Encounter for screening for malignant neoplasm of skin: Secondary | ICD-10-CM | POA: Diagnosis not present

## 2023-08-29 DIAGNOSIS — L218 Other seborrheic dermatitis: Secondary | ICD-10-CM | POA: Diagnosis not present

## 2023-08-29 DIAGNOSIS — L820 Inflamed seborrheic keratosis: Secondary | ICD-10-CM | POA: Diagnosis not present

## 2023-08-29 DIAGNOSIS — D225 Melanocytic nevi of trunk: Secondary | ICD-10-CM | POA: Diagnosis not present

## 2023-10-02 ENCOUNTER — Encounter: Payer: Self-pay | Admitting: Nurse Practitioner

## 2023-12-08 ENCOUNTER — Encounter: Payer: Self-pay | Admitting: Advanced Practice Midwife

## 2024-04-25 ENCOUNTER — Encounter: Payer: Medicare Other | Admitting: Nurse Practitioner

## 2024-05-22 ENCOUNTER — Encounter: Payer: Self-pay | Admitting: Nurse Practitioner

## 2024-05-22 ENCOUNTER — Ambulatory Visit: Admitting: Nurse Practitioner

## 2024-05-22 DIAGNOSIS — Z0000 Encounter for general adult medical examination without abnormal findings: Secondary | ICD-10-CM

## 2024-05-22 NOTE — Progress Notes (Signed)
 "  Chief Complaint  Patient presents with   Medicare Wellness    Patient presents today for annual wellness.      Subjective:   Carlos Hickman is a 68 y.o. male who presents for a Medicare Annual Wellness Visit.  Visit info / Clinical Intake: Medicare Wellness Visit Type:: Subsequent Annual Wellness Visit Persons participating in visit and providing information:: patient Medicare Wellness Visit Mode:: Video If Telephone or Video please confirm:: I connected with patient using audio/video enable telemedicine. I verified patient identity with two identifiers, discussed telehealth limitations, and patient agreed to proceed. Patient Location:: home Provider Location:: [facility] Interpreter Needed?: No Pre-visit prep was completed: no AWV questionnaire completed by patient prior to visit?: yes Living arrangements:: lives with spouse/significant other Patient's Overall Health Status Rating: excellent Typical amount of pain: some Does pain affect daily life?: no Are you currently prescribed opioids?: no  Dietary Habits and Nutritional Risks How many meals a day?: 3 Eats fruit and vegetables daily?: yes Most meals are obtained by: preparing own meals In the last 2 weeks, have you had any of the following?: none Diabetic:: no  Functional Status Activities of Daily Living (to include ambulation/medication): Independent Ambulation: Independent Medication Administration: Independent Home Management (perform basic housework or laundry): Independent Manage your own finances?: yes Primary transportation is: driving Concerns about vision?: no *vision screening is required for WTM* Concerns about hearing?: no  Fall Screening Falls in the past year?: 0 Number of falls in past year: 0 Was there an injury with Fall?: 0 Fall Risk Category Calculator: 0 Patient Fall Risk Level: Low Fall Risk  Fall Risk Patient at Risk for Falls Due to: No Fall Risks Fall risk Follow up: Falls evaluation  completed  Home and Transportation Safety: All rugs have non-skid backing?: yes All stairs or steps have railings?: N/A, no stairs Grab bars in the bathtub or shower?: yes Have non-skid surface in bathtub or shower?: yes Good home lighting?: yes Regular seat belt use?: yes Hospital stays in the last year:: no  Cognitive Assessment Difficulty concentrating, remembering, or making decisions? : no  Advance Directives (For Healthcare) Does Patient Have a Medical Advance Directive?: Yes Does patient want to make changes to medical advance directive?: Yes (MAU/Ambulatory/Procedural Areas - Information given) Type of Advance Directive: Living will; Healthcare Power of Attorney Copy of Healthcare Power of Attorney in Chart?: Yes - validated most recent copy scanned in chart (See row information) Copy of Living Will in Chart?: Yes - validated most recent copy scanned in chart (See row information)  Reviewed/Updated  Reviewed/Updated: Reviewed All (Medical, Surgical, Family, Medications, Allergies, Care Teams, Patient Goals)    Allergies (verified) Patient has no known allergies.   Current Medications (verified) Outpatient Encounter Medications as of 05/22/2024  Medication Sig   Calcium Carb-Cholecalciferol (CALTRATE 600+D3) 600-800 MG-UNIT TABS Take 1 tablet by mouth daily.   cetirizine (ZYRTEC) 10 MG tablet Take 10 mg by mouth as needed for allergies.   [DISCONTINUED] eszopiclone  (LUNESTA ) 1 MG TABS tablet Take 1 tablet (1 mg total) by mouth at bedtime as needed for sleep. Take immediately before bedtime (Patient not taking: Reported on 05/22/2024)   [DISCONTINUED] ibuprofen (ADVIL) 200 MG tablet Take 200 mg by mouth as needed. (Patient not taking: Reported on 05/22/2024)   [DISCONTINUED] lidocaine  4 % Place 1 patch onto the skin daily. (Patient not taking: Reported on 05/22/2024)   [DISCONTINUED] meloxicam  (MOBIC ) 7.5 MG tablet Take 1 tablet (7.5 mg total) by mouth daily. (Patient not  taking: Reported on 05/22/2024)   No facility-administered encounter medications on file as of 05/22/2024.    History: Past Medical History:  Diagnosis Date   Allergy    Anxiety    BPH (benign prostatic hyperplasia)    Depression    Diverticulosis    Heart murmur    Hyperlipidemia    Mitral valve prolapse    Past Surgical History:  Procedure Laterality Date   COLONOSCOPY  04/29/2021   and 2019 with Norleen Kiang at [facility]   WISDOM TOOTH EXTRACTION     Family History  Problem Relation Age of Onset   Diabetes Mother    Dementia Mother    Stroke Father    Colon cancer Paternal Aunt    Esophageal cancer Neg Hx    Liver cancer Neg Hx    Pancreatic cancer Neg Hx    Rectal cancer Neg Hx    Stomach cancer Neg Hx    Colon polyps Neg Hx    Social History   Occupational History   Not on file  Tobacco Use   Smoking status: Former    Current packs/day: 0.00    Types: Cigarettes    Quit date: 11/10/2014    Years since quitting: 9.5   Smokeless tobacco: Never   Tobacco comments:    About age 76 patient quit   Vaping Use   Vaping status: Never Used  Substance and Sexual Activity   Alcohol use: Yes    Alcohol/week: 7.0 standard drinks of alcohol    Types: 7 Standard drinks or equivalent per week   Drug use: Never   Sexual activity: Not on file   Tobacco Counseling Counseling given: Not Answered Tobacco comments: About age 29 patient quit   SDOH Screenings   Food Insecurity: No Food Insecurity (05/20/2024)  Housing: Unknown (05/20/2024)  Transportation Needs: No Transportation Needs (05/20/2024)  Utilities: Not At Risk (04/20/2023)  Alcohol Screen: Low Risk (05/20/2024)  Depression (PHQ2-9): Low Risk (05/22/2024)  Financial Resource Strain: Low Risk (05/20/2024)  Physical Activity: Sufficiently Active (05/20/2024)  Social Connections: Unknown (05/20/2024)  Stress: No Stress Concern Present (05/20/2024)  Tobacco Use: Medium Risk (05/22/2024)   See flowsheets for full  screening details  Depression Screen PHQ 2 & 9 Depression Scale- Over the past 2 weeks, how often have you been bothered by any of the following problems? Little interest or pleasure in doing things: 0 Feeling down, depressed, or hopeless (PHQ Adolescent also includes...irritable): 0 PHQ-2 Total Score: 0     Goals Addressed   None          Objective:    There were no vitals filed for this visit. There is no height or weight on file to calculate BMI.  Hearing/Vision screen Hearing Screening - Comments:: Denies any hearing issues.  Vision Screening - Comments:: 05/2023 no issues with vision.  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  Patient Care Team: Caro Harlene POUR, NP as PCP - General (Geriatric Medicine)  I  have personally reviewed and noted the following in the patients chart:   Medical and social history Use of alcohol, tobacco or illicit drugs  Current medications and supplements including opioid prescriptions. Functional ability and status Nutritional status Physical activity Advanced directives List of other physicians Hospitalizations, surgeries, and ER visits in previous 12 months Vitals Screenings to include cognitive, depression, and falls Referrals and appointments  No orders of the defined types were placed in this encounter.  In addition, I have reviewed and discussed with patient certain preventive protocols, quality metrics, and best practice recommendations. A written  personalized care plan for preventive services as well as general preventive health recommendations were provided to patient.   Harlene MARLA An, NP   05/22/2024   No follow-ups on file.  After Visit Summary: (MyChart) Due to this being a telephonic visit, the after visit summary with patients personalized plan was offered to patient via MyChart    "

## 2024-05-22 NOTE — Patient Instructions (Signed)
 Mr. Codrington,  Thank you for taking the time for your Medicare Wellness Visit. I appreciate your continued commitment to your health goals. Please review the care plan we discussed, and feel free to reach out if I can assist you further.  Please note that Annual Wellness Visits do not include a physical exam. Some assessments may be limited, especially if the visit was conducted virtually. If needed, we may recommend an in-person follow-up with your provider.  Ongoing Care Seeing your primary care provider every 3 to 6 months helps us  monitor your health and provide consistent, personalized care.   Referrals If a referral was made during today's visit and you haven't received any updates within two weeks, please contact the referred provider directly to check on the status.  Recommended Screenings:  Health Maintenance  Topic Date Due   Medicare Annual Wellness Visit  04/23/2024   COVID-19 Vaccine (10 - Pfizer risk 2025-26 season) 08/08/2024   DTaP/Tdap/Td vaccine (2 - Td or Tdap) 03/23/2025   Colon Cancer Screening  04/29/2026   Pneumococcal Vaccine for age over 79  Completed   Flu Shot  Completed   Hepatitis C Screening  Completed   Zoster (Shingles) Vaccine  Completed   Meningitis B Vaccine  Aged Out       05/22/2024    8:53 AM  Advanced Directives  Does Patient Have a Medical Advance Directive? Yes  Type of Advance Directive Living will;Healthcare Power of Attorney  Does patient want to make changes to medical advance directive? Yes (MAU/Ambulatory/Procedural Areas - Information given)  Copy of Healthcare Power of Attorney in Chart? Yes - validated most recent copy scanned in chart (See row information)    Vision: Annual vision screenings are recommended for early detection of glaucoma, cataracts, and diabetic retinopathy. These exams can also reveal signs of chronic conditions such as diabetes and high blood pressure.  Dental: Annual dental screenings help detect early signs  of oral cancer, gum disease, and other conditions linked to overall health, including heart disease and diabetes.  Please see the attached documents for additional preventive care recommendations.

## 2024-05-22 NOTE — Progress Notes (Signed)
 This service is provided via telemedicine  No vital signs collected/recorded due to the encounter was a telemedicine visit.   Location of patient (ex: home, work):  Home  Patient consents to a telephone visit: Yes  Location of the provider (ex: office, home):  [facility]  Name of any referring provider: Caro Harlene POUR, NP   Names of all persons participating in the telemedicine service and their role in the encounter:  [name], RMA, patient, and   Time spent on call:  8 minutes

## 2024-07-15 ENCOUNTER — Other Ambulatory Visit: Payer: Medicare Other

## 2024-07-19 ENCOUNTER — Ambulatory Visit: Payer: Medicare Other | Admitting: Nurse Practitioner

## 2025-05-26 ENCOUNTER — Ambulatory Visit: Admitting: Nurse Practitioner
